# Patient Record
Sex: Female | Born: 1990 | Race: Black or African American | Hispanic: No | Marital: Single | Smoking: Former smoker
Health system: Southern US, Community
[De-identification: ages and names within clinical notes are randomized; demographics above are authoritative.]

## PROBLEM LIST (undated history)

## (undated) ENCOUNTER — Inpatient Hospital Stay (HOSPITAL_COMMUNITY): Payer: Self-pay

## (undated) DIAGNOSIS — B009 Herpesviral infection, unspecified: Secondary | ICD-10-CM

## (undated) DIAGNOSIS — R51 Headache: Secondary | ICD-10-CM

## (undated) DIAGNOSIS — Z8781 Personal history of (healed) traumatic fracture: Secondary | ICD-10-CM

## (undated) DIAGNOSIS — B9689 Other specified bacterial agents as the cause of diseases classified elsewhere: Secondary | ICD-10-CM

## (undated) DIAGNOSIS — N760 Acute vaginitis: Secondary | ICD-10-CM

## (undated) DIAGNOSIS — B999 Unspecified infectious disease: Secondary | ICD-10-CM

## (undated) HISTORY — PX: TONSILLECTOMY: SUR1361

## (undated) HISTORY — PX: ROOT CANAL: SHX2363

---
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
## 2008-04-05 ENCOUNTER — Emergency Department (HOSPITAL_COMMUNITY): Admission: EM | Admit: 2008-04-05 | Discharge: 2008-04-05 | Payer: Self-pay | Admitting: Family Medicine

## 2009-10-01 ENCOUNTER — Emergency Department (HOSPITAL_COMMUNITY): Admission: EM | Admit: 2009-10-01 | Discharge: 2009-10-01 | Payer: Self-pay | Admitting: Family Medicine

## 2009-12-28 ENCOUNTER — Emergency Department (HOSPITAL_COMMUNITY): Admission: EM | Admit: 2009-12-28 | Discharge: 2009-12-28 | Payer: Self-pay | Admitting: Emergency Medicine

## 2010-02-22 ENCOUNTER — Inpatient Hospital Stay (HOSPITAL_COMMUNITY)
Admission: AD | Admit: 2010-02-22 | Discharge: 2010-02-22 | Payer: Self-pay | Source: Home / Self Care | Attending: Obstetrics and Gynecology | Admitting: Obstetrics and Gynecology

## 2010-02-27 LAB — GC/CHLAMYDIA PROBE AMP, GENITAL
Chlamydia, DNA Probe: NEGATIVE
GC Probe Amp, Genital: NEGATIVE

## 2010-02-27 LAB — URINALYSIS, ROUTINE W REFLEX MICROSCOPIC
Bilirubin Urine: NEGATIVE
Hgb urine dipstick: NEGATIVE
Ketones, ur: 40 mg/dL — AB
Nitrite: NEGATIVE
Protein, ur: NEGATIVE mg/dL
Specific Gravity, Urine: 1.025 (ref 1.005–1.030)
Urine Glucose, Fasting: NEGATIVE mg/dL
Urobilinogen, UA: 0.2 mg/dL (ref 0.0–1.0)
pH: 6 (ref 5.0–8.0)

## 2010-02-27 LAB — CBC
HCT: 34.8 % — ABNORMAL LOW (ref 36.0–46.0)
Hemoglobin: 11.9 g/dL — ABNORMAL LOW (ref 12.0–15.0)
MCH: 30.9 pg (ref 26.0–34.0)
MCHC: 34.2 g/dL (ref 30.0–36.0)
MCV: 90.4 fL (ref 78.0–100.0)
Platelets: 264 10*3/uL (ref 150–400)
RBC: 3.85 MIL/uL — ABNORMAL LOW (ref 3.87–5.11)
RDW: 13.8 % (ref 11.5–15.5)
WBC: 8.7 10*3/uL (ref 4.0–10.5)

## 2010-02-27 LAB — WET PREP, GENITAL
Trich, Wet Prep: NONE SEEN
Yeast Wet Prep HPF POC: NONE SEEN

## 2010-02-27 LAB — POCT PREGNANCY, URINE: Preg Test, Ur: POSITIVE

## 2010-04-25 LAB — URINALYSIS, ROUTINE W REFLEX MICROSCOPIC
Bilirubin Urine: NEGATIVE
Glucose, UA: NEGATIVE mg/dL
Hgb urine dipstick: NEGATIVE
Ketones, ur: >80 mg/dL — AB
Nitrite: NEGATIVE
Protein, ur: NEGATIVE mg/dL
Specific Gravity, Urine: 1.016 (ref 1.005–1.030)
Urobilinogen, UA: 1 mg/dL (ref 0.0–1.0)
pH: 6 (ref 5.0–8.0)

## 2010-04-25 LAB — URINE MICROSCOPIC-ADD ON

## 2010-04-25 LAB — POCT PREGNANCY, URINE: Preg Test, Ur: POSITIVE

## 2010-08-04 ENCOUNTER — Inpatient Hospital Stay (HOSPITAL_COMMUNITY)
Admission: AD | Admit: 2010-08-04 | Discharge: 2010-08-04 | Disposition: A | Discharge status: Home / Self Care | Payer: Medicaid Other | Source: Ambulatory Visit | Attending: Obstetrics | Admitting: Obstetrics

## 2010-08-04 DIAGNOSIS — O479 False labor, unspecified: Secondary | ICD-10-CM | POA: Insufficient documentation

## 2010-08-05 ENCOUNTER — Inpatient Hospital Stay (HOSPITAL_COMMUNITY)
Admission: AD | Admit: 2010-08-05 | Discharge: 2010-08-07 | DRG: 775 | Disposition: A | Discharge status: Home / Self Care | Payer: Medicaid Other | Source: Ambulatory Visit | Attending: Obstetrics | Admitting: Obstetrics

## 2010-08-05 DIAGNOSIS — Z22330 Carrier of Group B streptococcus: Secondary | ICD-10-CM

## 2010-08-05 DIAGNOSIS — O99892 Other specified diseases and conditions complicating childbirth: Secondary | ICD-10-CM | POA: Diagnosis present

## 2010-08-05 LAB — CBC
HCT: 36.5 % (ref 36.0–46.0)
Hemoglobin: 12.6 g/dL (ref 12.0–15.0)
MCH: 32.8 pg (ref 26.0–34.0)
MCHC: 34.5 g/dL (ref 30.0–36.0)
MCV: 95.1 fL (ref 78.0–100.0)
Platelets: 188 10*3/uL (ref 150–400)
RBC: 3.84 MIL/uL — ABNORMAL LOW (ref 3.87–5.11)
RDW: 14 % (ref 11.5–15.5)
WBC: 10.6 10*3/uL — ABNORMAL HIGH (ref 4.0–10.5)

## 2010-08-05 LAB — RPR: RPR Ser Ql: NONREACTIVE

## 2010-08-06 LAB — CBC
HCT: 33.1 % — ABNORMAL LOW (ref 36.0–46.0)
Hemoglobin: 11.1 g/dL — ABNORMAL LOW (ref 12.0–15.0)
MCH: 31.7 pg (ref 26.0–34.0)
MCHC: 33.5 g/dL (ref 30.0–36.0)
MCV: 94.6 fL (ref 78.0–100.0)
Platelets: 184 10*3/uL (ref 150–400)
RBC: 3.5 MIL/uL — ABNORMAL LOW (ref 3.87–5.11)
RDW: 14.1 % (ref 11.5–15.5)
WBC: 14.4 10*3/uL — ABNORMAL HIGH (ref 4.0–10.5)

## 2010-10-17 ENCOUNTER — Emergency Department (HOSPITAL_COMMUNITY)
Admission: EM | Admit: 2010-10-17 | Discharge: 2010-10-18 | Disposition: A | Discharge status: Home / Self Care | Payer: Self-pay | Attending: Emergency Medicine | Admitting: Emergency Medicine

## 2010-10-17 DIAGNOSIS — J069 Acute upper respiratory infection, unspecified: Secondary | ICD-10-CM | POA: Insufficient documentation

## 2010-10-17 DIAGNOSIS — J3489 Other specified disorders of nose and nasal sinuses: Secondary | ICD-10-CM | POA: Insufficient documentation

## 2010-10-17 DIAGNOSIS — R51 Headache: Secondary | ICD-10-CM | POA: Insufficient documentation

## 2010-10-17 DIAGNOSIS — IMO0001 Reserved for inherently not codable concepts without codable children: Secondary | ICD-10-CM | POA: Insufficient documentation

## 2010-11-07 LAB — POCT URINALYSIS DIP (DEVICE)
Glucose, UA: NEGATIVE
Nitrite: NEGATIVE
Operator id: 239701
Protein, ur: 100 — AB
Specific Gravity, Urine: 1.025
Urobilinogen, UA: 4 — ABNORMAL HIGH
pH: 6.5

## 2010-11-07 LAB — URINE CULTURE
Colony Count: NO GROWTH
Culture: NO GROWTH

## 2010-11-07 LAB — POCT PREGNANCY, URINE
Operator id: 239701
Preg Test, Ur: NEGATIVE

## 2010-11-24 LAB — POCT PREGNANCY, URINE
Operator id: 272551
Preg Test, Ur: NEGATIVE

## 2011-11-11 ENCOUNTER — Inpatient Hospital Stay (HOSPITAL_COMMUNITY): Payer: Self-pay

## 2011-11-11 ENCOUNTER — Encounter (HOSPITAL_COMMUNITY): Payer: Self-pay | Admitting: *Deleted

## 2011-11-11 ENCOUNTER — Inpatient Hospital Stay (HOSPITAL_COMMUNITY)
Admission: AD | Admit: 2011-11-11 | Discharge: 2011-11-12 | Disposition: A | Discharge status: Hospice | Payer: Self-pay | Source: Ambulatory Visit | Attending: Obstetrics & Gynecology | Admitting: Obstetrics & Gynecology

## 2011-11-11 DIAGNOSIS — R109 Unspecified abdominal pain: Secondary | ICD-10-CM | POA: Insufficient documentation

## 2011-11-11 DIAGNOSIS — O99891 Other specified diseases and conditions complicating pregnancy: Secondary | ICD-10-CM | POA: Insufficient documentation

## 2011-11-11 DIAGNOSIS — O26899 Other specified pregnancy related conditions, unspecified trimester: Secondary | ICD-10-CM

## 2011-11-11 LAB — URINALYSIS, ROUTINE W REFLEX MICROSCOPIC
Bilirubin Urine: NEGATIVE
Glucose, UA: NEGATIVE mg/dL
Hgb urine dipstick: NEGATIVE
Ketones, ur: NEGATIVE mg/dL
Leukocytes, UA: NEGATIVE
Nitrite: NEGATIVE
Protein, ur: NEGATIVE mg/dL
Specific Gravity, Urine: 1.02 (ref 1.005–1.030)
Urobilinogen, UA: 0.2 mg/dL (ref 0.0–1.0)
pH: 7 (ref 5.0–8.0)

## 2011-11-11 LAB — CBC WITH DIFFERENTIAL/PLATELET
Basophils Absolute: 0 10*3/uL (ref 0.0–0.1)
HCT: 33.6 % — ABNORMAL LOW (ref 36.0–46.0)
Lymphocytes Relative: 48 % — ABNORMAL HIGH (ref 12–46)
Monocytes Absolute: 0.6 10*3/uL (ref 0.1–1.0)
Neutro Abs: 3.5 10*3/uL (ref 1.7–7.7)
RBC: 3.7 MIL/uL — ABNORMAL LOW (ref 3.87–5.11)
RDW: 14.1 % (ref 11.5–15.5)
WBC: 8.2 10*3/uL (ref 4.0–10.5)

## 2011-11-11 LAB — WET PREP, GENITAL: Yeast Wet Prep HPF POC: NONE SEEN

## 2011-11-11 LAB — HCG, QUANTITATIVE, PREGNANCY: hCG, Beta Chain, Quant, S: 1413 m[IU]/mL — ABNORMAL HIGH (ref <5)

## 2011-11-11 LAB — POCT PREGNANCY, URINE: Preg Test, Ur: POSITIVE — AB

## 2011-11-11 NOTE — MAU Provider Note (Signed)
History     CSN: [id number]  Arrival date and time: 11/11/11 2058   None     Chief Complaint  Patient presents with  . Abdominal Pain  . Back Pain   HPI Janet Rodriguez is a 21 y.o. female @ 5.[redacted] weeks gestation who presents to MAU with abdominal pain. The pain is located in the lower abdomen. The pain started 2 weeks ago. She describes the pain as cramping. She rates the pain as 6/10. The pain comes and goes. The pain radiates to the lower back. Associated symptoms include dizziness. Last pap smear last year and was normal. The history was provided by the patient.  OB History    Grav Para Term Preterm Abortions TAB SAB Ect Mult Living   1 1 1       1       Past Medical History  Diagnosis Date  . No pertinent past medical history     Past Surgical History  Procedure Date  . No past surgeries     History reviewed. No pertinent family history.  History  Substance Use Topics  . Smoking status: Current Some Day Smoker -- 0.2 packs/day    Types: Cigarettes  . Smokeless tobacco: Not on file  . Alcohol Use: No    Allergies: No Known Allergies  No prescriptions prior to admission    Review of Systems  Constitutional: Negative for fever, chills and weight loss.  HENT: Negative for ear pain, nosebleeds, congestion, sore throat and neck pain.   Eyes: Negative for blurred vision, double vision, photophobia and pain.  Respiratory: Negative for cough, shortness of breath and wheezing.   Cardiovascular: Negative for chest pain, palpitations and leg swelling.  Gastrointestinal: Positive for heartburn and abdominal pain. Negative for nausea, vomiting, diarrhea and constipation.  Genitourinary: Positive for frequency. Negative for dysuria and urgency.  Musculoskeletal: Positive for back pain. Negative for myalgias.  Skin: Negative for itching and rash.  Neurological: Positive for dizziness. Negative for sensory change, speech change, seizures, weakness and headaches.    Endo/Heme/Allergies: Does not bruise/bleed easily.  Psychiatric/Behavioral: Negative for depression. The patient is not nervous/anxious and does not have insomnia.    Physical Exam   Blood pressure 112/61, pulse 80, temperature 97.9 F (36.6 C), temperature source Oral, resp. rate 16, height 5\' 5"  (1.651 m), weight 115 lb 9.6 oz (52.436 kg), last menstrual period 10/03/2011.  Physical Exam  Nursing note and vitals reviewed. Constitutional: She is oriented to person, place, and time. She appears well-developed and well-nourished. No distress.  HENT:  Head: Normocephalic and atraumatic.  Eyes: EOM are normal.  Neck: Neck supple.  Cardiovascular: Normal rate.   Respiratory: Effort normal.  GI: Soft. There is no tenderness.  Genitourinary:       External genitalia without lesions. Down discharge vaginal vault. Cervix long, closed, no CMT, no adnexal tenderness. Uterus without palpable enlargement.  Musculoskeletal: Normal range of motion.  Neurological: She is alert and oriented to person, place, and time.  Skin: Skin is warm and dry.  Psychiatric: She has a normal mood and affect. Her behavior is normal. Judgment and thought content normal.   Results for orders placed during the hospital encounter of 11/11/11 (from the past 24 hour(s))  URINALYSIS, ROUTINE W REFLEX MICROSCOPIC     Status: Normal   Collection Time   11/11/11  9:15 PM      Component Value Range   Color, Urine YELLOW  YELLOW   APPearance CLEAR  CLEAR  Specific Gravity, Urine 1.020  1.005 - 1.030   pH 7.0  5.0 - 8.0   Glucose, UA NEGATIVE  NEGATIVE mg/dL   Hgb urine dipstick NEGATIVE  NEGATIVE   Bilirubin Urine NEGATIVE  NEGATIVE   Ketones, ur NEGATIVE  NEGATIVE mg/dL   Protein, ur NEGATIVE  NEGATIVE mg/dL   Urobilinogen, UA 0.2  0.0 - 1.0 mg/dL   Nitrite NEGATIVE  NEGATIVE   Leukocytes, UA NEGATIVE  NEGATIVE  POCT PREGNANCY, URINE     Status: Abnormal   Collection Time   11/11/11  9:21 PM      Component Value  Range   Preg Test, Ur POSITIVE (*) NEGATIVE  CBC WITH DIFFERENTIAL     Status: Abnormal   Collection Time   11/11/11 10:08 PM      Component Value Range   WBC 8.2  4.0 - 10.5 K/uL   RBC 3.70 (*) 3.87 - 5.11 MIL/uL   Hemoglobin 11.3 (*) 12.0 - 15.0 g/dL   HCT 16.1 (*) 09.6 - 04.5 %   MCV 90.8  78.0 - 100.0 fL   MCH 30.5  26.0 - 34.0 pg   MCHC 33.6  30.0 - 36.0 g/dL   RDW 40.9  81.1 - 91.4 %   Platelets 285  150 - 400 K/uL   Neutrophils Relative 43  43 - 77 %   Neutro Abs 3.5  1.7 - 7.7 K/uL   Lymphocytes Relative 48 (*) 12 - 46 %   Lymphs Abs 3.9  0.7 - 4.0 K/uL   Monocytes Relative 7  3 - 12 %   Monocytes Absolute 0.6  0.1 - 1.0 K/uL   Eosinophils Relative 3  0 - 5 %   Eosinophils Absolute 0.2  0.0 - 0.7 K/uL   Basophils Relative 0  0 - 1 %   Basophils Absolute 0.0  0.0 - 0.1 K/uL  ABO/RH     Status: Normal (Preliminary result)   Collection Time   11/11/11 10:08 PM      Component Value Range   ABO/RH(D) A POS    HCG, QUANTITATIVE, PREGNANCY     Status: Abnormal   Collection Time   11/11/11 10:22 PM      Component Value Range   hCG, Beta Chain, Quant, S 1413 (*) <5 mIU/mL  WET PREP, GENITAL     Status: Abnormal   Collection Time   11/11/11 11:15 PM      Component Value Range   Yeast Wet Prep HPF POC NONE SEEN  NONE SEEN   Trich, Wet Prep NONE SEEN  NONE SEEN   Clue Cells Wet Prep HPF POC NONE SEEN  NONE SEEN   WBC, Wet Prep HPF POC MODERATE (*) NONE SEEN   US Ob Comp Less 14 Wks  11/12/2011  *RADIOLOGY REPORT*  Clinical Data: Pain with positive pregnancy test.  OBSTETRIC <14 WK [place] AND TRANSVAGINAL OB US  Technique:  Both transabdominal and transvaginal ultrasound examinations were performed for complete evaluation of the gestation as well as the maternal uterus, adnexal regions, and pelvic cul-de-sac.  Transvaginal technique was performed to assess early pregnancy.  Comparison:  None.  Intrauterine gestational sac:  The tiny cystic structure is identified in the endometrial  cavity the uterus.  This appears to be within the decidualized endometrium. Yolk sac: Not visualized Embryo: Not visualized Cardiac Activity: N/A  MSD: 4 mm  4 w 6 d [place] EDC: 07/14/2012  Maternal uterus/adnexae: No evidence for subchorionic hemorrhage. Right ovary is unremarkable  with corpus luteum cyst seen on the left. Trace amount of free fluid is seen just anterior to the uterus.  IMPRESSION: Tiny cystic structure in the endometrium suggests a tiny gestational sac with estimated 4-week-6-day gestational age.  No adnexal mass is evident to suggest ectopic gestation at this time. Only a very trace amount of free fluid is seen just anterior to the uterus.  Correlation with serial beta HCG is recommended and repeat ultrasound could be performed and 7-10 days to confirm appropriate pregnancy progression.   Original Report Authenticated By: ERIC A. MANSELL, M.D.    US Ob Transvaginal  11/12/2011  *RADIOLOGY REPORT*  Clinical Data: Pain with positive pregnancy test.  OBSTETRIC <14 WK [place] AND TRANSVAGINAL OB US  Technique:  Both transabdominal and transvaginal ultrasound examinations were performed for complete evaluation of the gestation as well as the maternal uterus, adnexal regions, and pelvic cul-de-sac.  Transvaginal technique was performed to assess early pregnancy.  Comparison:  None.  Intrauterine gestational sac:  The tiny cystic structure is identified in the endometrial cavity the uterus.  This appears to be within the decidualized endometrium. Yolk sac: Not visualized Embryo: Not visualized Cardiac Activity: N/A  MSD: 4 mm  4 w 6 d [place] EDC: 07/14/2012  Maternal uterus/adnexae: No evidence for subchorionic hemorrhage. Right ovary is unremarkable with corpus luteum cyst seen on the left. Trace amount of free fluid is seen just anterior to the uterus.  IMPRESSION: Tiny cystic structure in the endometrium suggests a tiny gestational sac with estimated 4-week-6-day gestational age.  No adnexal mass is evident to  suggest ectopic gestation at this time. Only a very trace amount of free fluid is seen just anterior to the uterus.  Correlation with serial beta HCG is recommended and repeat ultrasound could be performed and 7-10 days to confirm appropriate pregnancy progression.   Original Report Authenticated By: ERIC A. MANSELL, M.D.    Assessment: 21 y.o. female @ 4 weeks 6 days gestation with abdominal pain   Discomforts of pregnancy  Plan:  Repeat Bhcg in 48 hours, return sooner for problems.  I have reviewed this patient's vital signs, nurses notes, appropriate labs and imaging.  MAU Course  Procedures   NEESE,HOPE, RN, FNP, [facility] 11/11/2011, 11:16 PM

## 2011-11-11 NOTE — MAU Note (Signed)
Pt LMP 8/21, only lasting 2 days having lower abd and back pain x 2 wks.  Denies bleeding, discharge or pain with urination.

## 2011-11-13 NOTE — MAU Provider Note (Signed)
Medical Screening exam and patient care preformed by advanced practice provider.  Agree with the above management.  

## 2011-12-18 ENCOUNTER — Encounter (HOSPITAL_COMMUNITY): Payer: Self-pay | Admitting: Family

## 2011-12-18 ENCOUNTER — Inpatient Hospital Stay (HOSPITAL_COMMUNITY)
Admission: AD | Admit: 2011-12-18 | Discharge: 2011-12-18 | Disposition: A | Discharge status: Home / Self Care | Payer: Medicaid Other | Source: Ambulatory Visit | Attending: Family Medicine | Admitting: Family Medicine

## 2011-12-18 DIAGNOSIS — R42 Dizziness and giddiness: Secondary | ICD-10-CM | POA: Insufficient documentation

## 2011-12-18 DIAGNOSIS — B373 Candidiasis of vulva and vagina: Secondary | ICD-10-CM

## 2011-12-18 DIAGNOSIS — R51 Headache: Secondary | ICD-10-CM | POA: Insufficient documentation

## 2011-12-18 DIAGNOSIS — L293 Anogenital pruritus, unspecified: Secondary | ICD-10-CM | POA: Insufficient documentation

## 2011-12-18 DIAGNOSIS — N760 Acute vaginitis: Secondary | ICD-10-CM | POA: Insufficient documentation

## 2011-12-18 DIAGNOSIS — O2390 Unspecified genitourinary tract infection in pregnancy, unspecified trimester: Secondary | ICD-10-CM | POA: Insufficient documentation

## 2011-12-18 LAB — WET PREP, GENITAL
Clue Cells Wet Prep HPF POC: NONE SEEN
Trich, Wet Prep: NONE SEEN

## 2011-12-18 LAB — URINALYSIS, ROUTINE W REFLEX MICROSCOPIC
Bilirubin Urine: NEGATIVE
Glucose, UA: NEGATIVE mg/dL
Hgb urine dipstick: NEGATIVE
Ketones, ur: NEGATIVE mg/dL
Nitrite: NEGATIVE

## 2011-12-18 MED ORDER — ACETAMINOPHEN 500 MG PO TABS
1000.0000 mg | ORAL_TABLET | Freq: Four times a day (QID) | ORAL | Status: DC | PRN
  Administered 2011-12-18: 1000 mg via ORAL
  Filled 2011-12-18: qty 2

## 2011-12-18 MED ORDER — CLOTRIMAZOLE 1 % EX CREA: TOPICAL_CREAM | CUTANEOUS | Status: DC

## 2011-12-18 NOTE — MAU Provider Note (Signed)
History     CSN: [id number]  Arrival date and time: 12/18/11 1227   None     No chief complaint on file.  HPI 21 y.o. G2P1001 at [redacted]w[redacted]d with dizziness, headache, vaginal itching.  Headache throughout pregnancy. Nauseated. No bleeding or cramping. Vaginal discharge - thick and gooey.   OB History    Grav Para Term Preterm Abortions TAB SAB Ect Mult Living   2 1 1       1       Past Medical History  Diagnosis Date  . No pertinent past medical history     Past Surgical History  Procedure Date  . No past surgeries     History reviewed. No pertinent family history.  History  Substance Use Topics  . Smoking status: Former Smoker -- 0.2 packs/day    Types: Cigarettes    Quit date: 10/28/2011  . Smokeless tobacco: Not on file  . Alcohol Use: No    Allergies: No Known Allergies  No prescriptions prior to admission    Review of Systems  Constitutional: Negative for fever and chills.  Eyes:       Spots in vision when stands up  Respiratory: Negative for shortness of breath.   Cardiovascular: Negative for chest pain and palpitations.  Gastrointestinal: Positive for nausea and vomiting. Negative for abdominal pain, diarrhea and constipation.  Genitourinary: Negative for dysuria.  Neurological: Positive for dizziness. Negative for headaches.   Physical Exam   Blood pressure 87/68, pulse 83, temperature 97.2 F (36.2 C), temperature source Oral, resp. rate 16, height 5\' 5"  (1.651 m), weight 50.894 kg (112 lb 3.2 oz), last menstrual period 10/03/2011.  Physical Exam  Constitutional: She is oriented to person, place, and time. She appears well-developed and well-nourished. No distress.  HENT:  Head: Normocephalic and atraumatic.  Eyes: Conjunctivae normal and EOM are normal.  Neck: Normal range of motion. Neck supple.  Cardiovascular: Normal rate, regular rhythm and normal heart sounds.   Respiratory: Effort normal and breath sounds normal. No respiratory distress.    GI: Soft. There is no tenderness. There is no rebound and no guarding.  Genitourinary:       Normal external genitalia, Erythematous  vagina, moderate thick Schaff discharge. Tender on speculum exam.   Musculoskeletal: Normal range of motion. She exhibits no edema and no tenderness.  Neurological: She is alert and oriented to person, place, and time.  Skin: Skin is warm and dry.  Psychiatric: She has a normal mood and affect.    Results for orders placed during the hospital encounter of 12/18/11 (from the past 24 hour(s))  URINALYSIS, ROUTINE W REFLEX MICROSCOPIC     Status: Normal   Collection Time   12/18/11 12:40 PM      Component Value Range   Color, Urine YELLOW  YELLOW   APPearance CLEAR  CLEAR   Specific Gravity, Urine 1.025  1.005 - 1.030   pH 6.5  5.0 - 8.0   Glucose, UA NEGATIVE  NEGATIVE mg/dL   Hgb urine dipstick NEGATIVE  NEGATIVE   Bilirubin Urine NEGATIVE  NEGATIVE   Ketones, ur NEGATIVE  NEGATIVE mg/dL   Protein, ur NEGATIVE  NEGATIVE mg/dL   Urobilinogen, UA 0.2  0.0 - 1.0 mg/dL   Nitrite NEGATIVE  NEGATIVE   Leukocytes, UA NEGATIVE  NEGATIVE  WET PREP, GENITAL     Status: Abnormal   Collection Time   12/18/11  1:15 PM      Component Value Range   Yeast  Wet Prep HPF POC NONE SEEN  NONE SEEN   Trich, Wet Prep NONE SEEN  NONE SEEN   Clue Cells Wet Prep HPF POC NONE SEEN  NONE SEEN   WBC, Wet Prep HPF POC FEW (*) NONE SEEN   FHTs by ultrasound:  143 Bedside dono shows viable IUP.  MAU Course  Procedures  1000 mg Tylenol given in ED and PO fluids Repeat BP 93/76.  Pt feeling better.  Assessment and Plan  21 y.o. G2P1001 at [redacted]w[redacted]d by LMP  1.  IUP - viable, positive FHTs 2.  Vaginitis, will treat for yeast despite negative wet prep. Clotrimazole vaginal cream 3.  Headache/dizziness - low BP. Tylenol, fluids, good hydration, zofran for nausea. 4.  Establish care with Dr. Gaynell Face as soon as possible.   Napoleon Form, MD  Napoleon Form 12/18/2011, 1:04 PM

## 2011-12-18 NOTE — MAU Note (Signed)
Pt reports vaginal itching for about one month.

## 2012-02-13 NOTE — L&D Delivery Note (Signed)
Delivery Note At 5:56 PM a viable female was delivered via  (Presentation: ;  ).  APGAR: , ; weight .   Placenta status: , .  Cord:  with the following complications: .  Cord pH: not done  Anesthesia: Epidural  Episiotomy:  Lacerations:  Suture Repair: 2.0 Est. Blood Loss (mL):   Mom to postpartum.  Baby to nursery-stable.  Ferd Horrigan A 07/09/2012, 6:03 PM

## 2012-02-18 ENCOUNTER — Encounter (HOSPITAL_COMMUNITY): Payer: Self-pay | Admitting: Advanced Practice Midwife

## 2012-02-18 ENCOUNTER — Inpatient Hospital Stay (HOSPITAL_COMMUNITY)
Admission: AD | Admit: 2012-02-18 | Discharge: 2012-02-18 | Disposition: A | Discharge status: Home / Self Care | Payer: Medicaid Other | Source: Ambulatory Visit | Attending: Obstetrics & Gynecology | Admitting: Obstetrics & Gynecology

## 2012-02-18 DIAGNOSIS — O0930 Supervision of pregnancy with insufficient antenatal care, unspecified trimester: Secondary | ICD-10-CM

## 2012-02-18 DIAGNOSIS — Z3490 Encounter for supervision of normal pregnancy, unspecified, unspecified trimester: Secondary | ICD-10-CM

## 2012-02-18 DIAGNOSIS — Z3201 Encounter for pregnancy test, result positive: Secondary | ICD-10-CM

## 2012-02-18 DIAGNOSIS — Z3480 Encounter for supervision of other normal pregnancy, unspecified trimester: Secondary | ICD-10-CM | POA: Insufficient documentation

## 2012-02-18 HISTORY — DX: Headache: R51

## 2012-02-18 NOTE — MAU Provider Note (Signed)
  History     CSN: [id number]  Arrival date and time: 02/18/12 2013   First Provider Initiated Contact with Patient 02/18/12 2158      Chief Complaint  Patient presents with  . New Evaluation   HPI This is a 22 y.o. G2 P1 at [redacted]w[redacted]d who presents "to get a check up for my baby and me".  Has not gotten Medicaid yet so cannot go see Dr Gaynell Face and just wants to see if baby is OK. Denies complaints. Feels movement this week. Denies bleeding.   RN Note: Pt G2 P1 at 19.5wks, no prenatal care due to lack of medicaid. Pt requesting check up for her baby. Denies pain or bleeding.      OB History    Grav Para Term Preterm Abortions TAB SAB Ect Mult Living   2 1 1       1       Past Medical History  Diagnosis Date  . Headache     Past Surgical History  Procedure Date  . No past surgeries     History reviewed. No pertinent family history.  History  Substance Use Topics  . Smoking status: Former Smoker -- 0.2 packs/day    Types: Cigarettes    Quit date: 10/28/2011  . Smokeless tobacco: Not on file  . Alcohol Use: No    Allergies: No Known Allergies  Prescriptions prior to admission  Medication Sig Dispense Refill  . acetaminophen (TYLENOL) 500 MG tablet Take 500 mg by mouth every 6 (six) hours as needed. migrain      . Prenatal Vit-Fe Fumarate-FA (PRENATAL MULTIVITAMIN) TABS Take 1 tablet by mouth every morning.      . clotrimazole (LOTRIMIN) 1 % cream Apply to affected area 2 times daily  15 g  0    Review of Systems  Constitutional: Negative for fever and malaise/fatigue.  Respiratory: Negative for cough.   Cardiovascular: Negative for chest pain.  Gastrointestinal: Negative for heartburn, nausea, vomiting, abdominal pain, diarrhea and constipation.  Genitourinary: Negative for dysuria.  Neurological: Negative for dizziness and headaches.   Physical Exam   Blood pressure 107/53, pulse 78, temperature 98 F (36.7 C), temperature source Oral, resp. rate 18, height  5\' 6"  (1.676 m), weight 122 lb 9.6 oz (55.611 kg), last menstrual period 10/03/2011.  Physical Exam  Constitutional: She is oriented to person, place, and time. She appears well-developed and well-nourished. No distress.  HENT:  Head: Normocephalic.  Neck: Normal range of motion. Neck supple.  Cardiovascular: Normal rate.   Respiratory: Effort normal.  GI: Soft. She exhibits no distension and no mass. There is no tenderness. There is no rebound and no guarding.       Fundus at umbilicus  Genitourinary: No vaginal discharge found.  Musculoskeletal: Normal range of motion.  Neurological: She is alert and oriented to person, place, and time.  Skin: Skin is warm and dry.  Psychiatric: She has a normal mood and affect.   FHR 160s per RN No results found for this or any previous visit (from the past 24 hour(s)).  MAU Course  Procedures  Assessment and Plan  A:  SIUP at [redacted]w[redacted]d       Normal exam  P:  Will schedule outpatient anatomy scan       Pt to go to Dr Gaynell Face for prenatal care.   Wynelle Bourgeois 02/18/2012, 10:06 PM

## 2012-02-18 NOTE — MAU Note (Signed)
Pt G2 P1 at 19.5wks, no prenatal care due to lack of medicaid.  Pt requesting check up for her baby.  Denies pain or bleeding.

## 2012-02-21 ENCOUNTER — Ambulatory Visit (HOSPITAL_COMMUNITY)
Admission: RE | Admit: 2012-02-21 | Discharge: 2012-02-21 | Disposition: A | Discharge status: Home / Self Care | Payer: Medicaid Other | Source: Ambulatory Visit | Attending: Advanced Practice Midwife | Admitting: Advanced Practice Midwife

## 2012-02-21 DIAGNOSIS — Z363 Encounter for antenatal screening for malformations: Secondary | ICD-10-CM | POA: Insufficient documentation

## 2012-02-21 DIAGNOSIS — Z3490 Encounter for supervision of normal pregnancy, unspecified, unspecified trimester: Secondary | ICD-10-CM

## 2012-02-21 DIAGNOSIS — O358XX0 Maternal care for other (suspected) fetal abnormality and damage, not applicable or unspecified: Secondary | ICD-10-CM | POA: Insufficient documentation

## 2012-02-21 DIAGNOSIS — O0930 Supervision of pregnancy with insufficient antenatal care, unspecified trimester: Secondary | ICD-10-CM | POA: Insufficient documentation

## 2012-02-21 DIAGNOSIS — Z1389 Encounter for screening for other disorder: Secondary | ICD-10-CM | POA: Insufficient documentation

## 2012-05-02 LAB — OB RESULTS CONSOLE GC/CHLAMYDIA
Chlamydia: NEGATIVE
Gonorrhea: NEGATIVE

## 2012-05-02 LAB — OB RESULTS CONSOLE RPR: RPR: NONREACTIVE

## 2012-05-02 LAB — OB RESULTS CONSOLE ANTIBODY SCREEN: Antibody Screen: NEGATIVE

## 2012-06-03 LAB — OB RESULTS CONSOLE GBS: GBS: NEGATIVE

## 2012-06-23 ENCOUNTER — Inpatient Hospital Stay (HOSPITAL_COMMUNITY)
Admission: AD | Admit: 2012-06-23 | Discharge: 2012-06-24 | Disposition: A | Discharge status: Home / Self Care | Payer: Medicaid Other | Source: Ambulatory Visit | Attending: Obstetrics | Admitting: Obstetrics

## 2012-06-23 DIAGNOSIS — O479 False labor, unspecified: Secondary | ICD-10-CM | POA: Insufficient documentation

## 2012-06-23 NOTE — MAU Note (Signed)
Pt G2 P1 at 37.5wks having contractions since 2230.

## 2012-07-09 ENCOUNTER — Inpatient Hospital Stay (HOSPITAL_COMMUNITY): Payer: Medicaid Other | Admitting: Anesthesiology

## 2012-07-09 ENCOUNTER — Inpatient Hospital Stay (HOSPITAL_COMMUNITY)
Admission: RE | Admit: 2012-07-09 | Discharge: 2012-07-10 | DRG: 774 | Disposition: A | Discharge status: Home / Self Care | Payer: Medicaid Other | Source: Ambulatory Visit | Attending: Obstetrics | Admitting: Obstetrics

## 2012-07-09 ENCOUNTER — Encounter (HOSPITAL_COMMUNITY): Payer: Self-pay | Admitting: Anesthesiology

## 2012-07-09 ENCOUNTER — Encounter (HOSPITAL_COMMUNITY): Payer: Self-pay

## 2012-07-09 DIAGNOSIS — Z22330 Carrier of Group B streptococcus: Secondary | ICD-10-CM

## 2012-07-09 DIAGNOSIS — O98519 Other viral diseases complicating pregnancy, unspecified trimester: Secondary | ICD-10-CM | POA: Diagnosis present

## 2012-07-09 DIAGNOSIS — A6000 Herpesviral infection of urogenital system, unspecified: Secondary | ICD-10-CM | POA: Diagnosis present

## 2012-07-09 DIAGNOSIS — O99892 Other specified diseases and conditions complicating childbirth: Principal | ICD-10-CM | POA: Diagnosis present

## 2012-07-09 HISTORY — DX: Unspecified infectious disease: B99.9

## 2012-07-09 LAB — CBC
HCT: 35.2 % — ABNORMAL LOW (ref 36.0–46.0)
Hemoglobin: 11.9 g/dL — ABNORMAL LOW (ref 12.0–15.0)
MCH: 31.2 pg (ref 26.0–34.0)
MCV: 92.4 fL (ref 78.0–100.0)
Platelets: 236 10*3/uL (ref 150–400)
RBC: 3.81 MIL/uL — ABNORMAL LOW (ref 3.87–5.11)
WBC: 9.8 10*3/uL (ref 4.0–10.5)

## 2012-07-09 MED ORDER — ONDANSETRON HCL 4 MG/2ML IJ SOLN: 4.0000 mg | INTRAMUSCULAR | Status: DC | PRN

## 2012-07-09 MED ORDER — PHENYLEPHRINE 40 MCG/ML (10ML) SYRINGE FOR IV PUSH (FOR BLOOD PRESSURE SUPPORT)
80.0000 ug | PREFILLED_SYRINGE | INTRAVENOUS | Status: DC | PRN
  Filled 2012-07-09: qty 2
  Filled 2012-07-09: qty 5

## 2012-07-09 MED ORDER — FERROUS SULFATE 325 (65 FE) MG PO TABS
325.0000 mg | ORAL_TABLET | Freq: Two times a day (BID) | ORAL | Status: DC
  Administered 2012-07-10 (×2): 325 mg via ORAL
  Filled 2012-07-09 (×2): qty 1

## 2012-07-09 MED ORDER — LACTATED RINGERS IV SOLN
INTRAVENOUS | Status: DC
  Administered 2012-07-09: 250 mL via INTRAVENOUS
  Administered 2012-07-09: 125 mL/h via INTRAVENOUS

## 2012-07-09 MED ORDER — ONDANSETRON HCL 4 MG/2ML IJ SOLN
4.0000 mg | Freq: Four times a day (QID) | INTRAMUSCULAR | Status: DC | PRN
  Administered 2012-07-09: 4 mg via INTRAVENOUS
  Filled 2012-07-09: qty 2

## 2012-07-09 MED ORDER — PRENATAL MULTIVITAMIN CH
1.0000 | ORAL_TABLET | Freq: Every day | ORAL | Status: DC
  Filled 2012-07-09: qty 1

## 2012-07-09 MED ORDER — CITRIC ACID-SODIUM CITRATE 334-500 MG/5ML PO SOLN: 30.0000 mL | ORAL | Status: DC | PRN

## 2012-07-09 MED ORDER — TETANUS-DIPHTH-ACELL PERTUSSIS 5-2.5-18.5 LF-MCG/0.5 IM SUSP: 0.5000 mL | Freq: Once | INTRAMUSCULAR | Status: DC

## 2012-07-09 MED ORDER — EPHEDRINE 5 MG/ML INJ
10.0000 mg | INTRAVENOUS | Status: DC | PRN
  Filled 2012-07-09: qty 4
  Filled 2012-07-09: qty 2

## 2012-07-09 MED ORDER — ONDANSETRON HCL 4 MG PO TABS: 4.0000 mg | ORAL_TABLET | ORAL | Status: DC | PRN

## 2012-07-09 MED ORDER — OXYTOCIN 40 UNITS IN LACTATED RINGERS INFUSION - SIMPLE MED
62.5000 mL/h | INTRAVENOUS | Status: DC
  Administered 2012-07-09: 62.5 mL/h via INTRAVENOUS

## 2012-07-09 MED ORDER — OXYCODONE-ACETAMINOPHEN 5-325 MG PO TABS
1.0000 | ORAL_TABLET | ORAL | Status: DC | PRN
  Administered 2012-07-09 – 2012-07-10 (×3): 2 via ORAL
  Filled 2012-07-09 (×3): qty 2

## 2012-07-09 MED ORDER — OXYTOCIN 40 UNITS IN LACTATED RINGERS INFUSION - SIMPLE MED
1.0000 m[IU]/min | INTRAVENOUS | Status: DC
  Administered 2012-07-09: 2 m[IU]/min via INTRAVENOUS
  Filled 2012-07-09: qty 1000

## 2012-07-09 MED ORDER — ACETAMINOPHEN 325 MG PO TABS: 650.0000 mg | ORAL_TABLET | ORAL | Status: DC | PRN

## 2012-07-09 MED ORDER — OXYCODONE-ACETAMINOPHEN 5-325 MG PO TABS: 1.0000 | ORAL_TABLET | ORAL | Status: DC | PRN

## 2012-07-09 MED ORDER — ZOLPIDEM TARTRATE 5 MG PO TABS: 5.0000 mg | ORAL_TABLET | Freq: Every evening | ORAL | Status: DC | PRN

## 2012-07-09 MED ORDER — DIBUCAINE 1 % RE OINT
1.0000 "application " | TOPICAL_OINTMENT | RECTAL | Status: DC | PRN
  Administered 2012-07-09: 1 via RECTAL
  Filled 2012-07-09: qty 28

## 2012-07-09 MED ORDER — LANOLIN HYDROUS EX OINT: TOPICAL_OINTMENT | CUTANEOUS | Status: DC | PRN

## 2012-07-09 MED ORDER — IBUPROFEN 600 MG PO TABS
600.0000 mg | ORAL_TABLET | Freq: Four times a day (QID) | ORAL | Status: DC | PRN
  Filled 2012-07-09: qty 1

## 2012-07-09 MED ORDER — EPHEDRINE 5 MG/ML INJ
10.0000 mg | INTRAVENOUS | Status: DC | PRN
  Filled 2012-07-09: qty 2

## 2012-07-09 MED ORDER — LACTATED RINGERS IV SOLN: 500.0000 mL | INTRAVENOUS | Status: DC | PRN

## 2012-07-09 MED ORDER — TERBUTALINE SULFATE 1 MG/ML IJ SOLN: 0.2500 mg | Freq: Once | INTRAMUSCULAR | Status: DC | PRN

## 2012-07-09 MED ORDER — VALACYCLOVIR HCL 500 MG PO TABS
500.0000 mg | ORAL_TABLET | Freq: Every day | ORAL | Status: DC
  Administered 2012-07-10: 500 mg via ORAL
  Filled 2012-07-09 (×3): qty 1

## 2012-07-09 MED ORDER — WITCH HAZEL-GLYCERIN EX PADS
1.0000 "application " | MEDICATED_PAD | CUTANEOUS | Status: DC | PRN
  Administered 2012-07-09: 1 via TOPICAL

## 2012-07-09 MED ORDER — FLEET ENEMA 7-19 GM/118ML RE ENEM: 1.0000 | ENEMA | Freq: Every day | RECTAL | Status: DC | PRN

## 2012-07-09 MED ORDER — SIMETHICONE 80 MG PO CHEW: 80.0000 mg | CHEWABLE_TABLET | ORAL | Status: DC | PRN

## 2012-07-09 MED ORDER — FENTANYL 2.5 MCG/ML BUPIVACAINE 1/10 % EPIDURAL INFUSION (WH - ANES)
14.0000 mL/h | INTRAMUSCULAR | Status: DC | PRN
  Administered 2012-07-09: 14 mL/h via EPIDURAL
  Filled 2012-07-09: qty 125

## 2012-07-09 MED ORDER — LIDOCAINE HCL (PF) 1 % IJ SOLN
30.0000 mL | INTRAMUSCULAR | Status: DC | PRN
  Filled 2012-07-09: qty 30

## 2012-07-09 MED ORDER — SENNOSIDES-DOCUSATE SODIUM 8.6-50 MG PO TABS
2.0000 | ORAL_TABLET | Freq: Every day | ORAL | Status: DC
  Administered 2012-07-09: 2 via ORAL

## 2012-07-09 MED ORDER — LIDOCAINE HCL (PF) 1 % IJ SOLN
INTRAMUSCULAR | Status: DC | PRN
  Administered 2012-07-09 (×4): 4 mL

## 2012-07-09 MED ORDER — IBUPROFEN 600 MG PO TABS
600.0000 mg | ORAL_TABLET | Freq: Four times a day (QID) | ORAL | Status: DC
  Administered 2012-07-09 – 2012-07-10 (×4): 600 mg via ORAL
  Filled 2012-07-09 (×3): qty 1

## 2012-07-09 MED ORDER — PHENYLEPHRINE 40 MCG/ML (10ML) SYRINGE FOR IV PUSH (FOR BLOOD PRESSURE SUPPORT)
80.0000 ug | PREFILLED_SYRINGE | INTRAVENOUS | Status: DC | PRN
  Filled 2012-07-09: qty 2

## 2012-07-09 MED ORDER — BUTORPHANOL TARTRATE 1 MG/ML IJ SOLN: 1.0000 mg | INTRAMUSCULAR | Status: DC | PRN

## 2012-07-09 MED ORDER — DIPHENHYDRAMINE HCL 50 MG/ML IJ SOLN: 12.5000 mg | INTRAMUSCULAR | Status: DC | PRN

## 2012-07-09 MED ORDER — BENZOCAINE-MENTHOL 20-0.5 % EX AERO
1.0000 "application " | INHALATION_SPRAY | CUTANEOUS | Status: DC | PRN
  Administered 2012-07-09: 1 via TOPICAL
  Filled 2012-07-09: qty 56

## 2012-07-09 MED ORDER — LACTATED RINGERS IV SOLN
500.0000 mL | Freq: Once | INTRAVENOUS | Status: AC
  Administered 2012-07-09: 500 mL via INTRAVENOUS

## 2012-07-09 MED ORDER — DIPHENHYDRAMINE HCL 25 MG PO CAPS: 25.0000 mg | ORAL_CAPSULE | Freq: Four times a day (QID) | ORAL | Status: DC | PRN

## 2012-07-09 MED ORDER — OXYTOCIN BOLUS FROM INFUSION
500.0000 mL | INTRAVENOUS | Status: DC
  Administered 2012-07-09: 500 mL via INTRAVENOUS

## 2012-07-09 NOTE — Plan of Care (Signed)
Problem: Phase I Progression Outcomes Goal: Pain controlled with appropriate interventions Outcome: Completed/Met Date Met:  07/09/12 Mom wants early discharge.

## 2012-07-09 NOTE — H&P (Signed)
This is Dr. 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

## 2012-07-09 NOTE — Progress Notes (Signed)
Pt and family having issues regarding visitors, security involved.  Pt mother left room.  Pt upset and crying

## 2012-07-09 NOTE — Anesthesia Procedure Notes (Signed)
Epidural Patient location during procedure: OB Start time: 07/09/2012 12:56 PM  Staffing Performed by: anesthesiologist   Preanesthetic Checklist Completed: patient identified, site marked, surgical consent, pre-op evaluation, timeout performed, IV checked, risks and benefits discussed and monitors and equipment checked  Epidural Patient position: sitting Prep: site prepped and draped and DuraPrep Patient monitoring: continuous pulse ox and blood pressure Approach: midline Injection technique: LOR air  Needle:  Needle type: Tuohy  Needle gauge: 17 G Needle length: 9 cm and 9 Needle insertion depth: 4.5 cm Catheter type: closed end flexible Catheter size: 19 Gauge Catheter at skin depth: 9.5 cm Test dose: negative  Assessment Events: blood not aspirated, injection not painful, no injection resistance, negative IV test and no paresthesia  Additional Notes Discussed risk of headache, infection, bleeding, nerve injury and failed or incomplete block.  Patient voices understanding and wishes to proceed.  Epidural placed easily on first attempt.  No paresthesia.  Patient tolerated procedure well with no apparent complications.  Jasmine December, MDReason for block:procedure for pain

## 2012-07-09 NOTE — Anesthesia Preprocedure Evaluation (Signed)
Anesthesia Evaluation  Patient identified by MRN, date of birth, ID band Patient awake    Reviewed: Allergy & Precautions, H&P , NPO status , Patient's Chart, lab work & pertinent test results, reviewed documented beta blocker date and time   History of Anesthesia Complications Negative for: history of anesthetic complications  Airway Mallampati: II TM Distance: >3 FB Neck ROM: full    Dental  (+) Teeth Intact   Pulmonary former smoker,  breath sounds clear to auscultation        Cardiovascular negative cardio ROS  Rhythm:regular Rate:Normal     Neuro/Psych negative neurological ROS  negative psych ROS   GI/Hepatic negative GI ROS, Neg liver ROS,   Endo/Other  negative endocrine ROS  Renal/GU negative Renal ROS     Musculoskeletal   Abdominal   Peds  Hematology negative hematology ROS (+)   Anesthesia Other Findings   Reproductive/Obstetrics (+) Pregnancy                           Anesthesia Physical Anesthesia Plan  ASA: II  Anesthesia Plan: Epidural   Post-op Pain Management:    Induction:   Airway Management Planned:   Additional Equipment:   Intra-op Plan:   Post-operative Plan:   Informed Consent: I have reviewed the patients History and Physical, chart, labs and discussed the procedure including the risks, benefits and alternatives for the proposed anesthesia with the patient or authorized representative who has indicated his/her understanding and acceptance.     Plan Discussed with:   Anesthesia Plan Comments:         Anesthesia Quick Evaluation

## 2012-07-10 LAB — CBC
Hemoglobin: 10.7 g/dL — ABNORMAL LOW (ref 12.0–15.0)
Platelets: 195 10*3/uL (ref 150–400)
RBC: 3.45 MIL/uL — ABNORMAL LOW (ref 3.87–5.11)
WBC: 10.4 10*3/uL (ref 4.0–10.5)

## 2012-07-10 MED ORDER — COMPLETENATE 29-1 MG PO CHEW
1.0000 | CHEWABLE_TABLET | Freq: Every day | ORAL | Status: DC
  Administered 2012-07-10: 1 via ORAL
  Filled 2012-07-10 (×2): qty 1

## 2012-07-10 NOTE — Clinical Social Work Maternal (Signed)
    Clinical Social Work Department PSYCHOSOCIAL ASSESSMENT - MATERNAL/CHILD 07/10/2012  Patient:  Janet Rodriguez, Janet Rodriguez  Account Number:  [id number]  Admit Date:  07/09/2012  Marjo Bicker Name:   Floreen Comber    Clinical Social Worker:  Nobie Putnam, LCSW   Date/Time:  07/10/2012 03:02 PM  Date Referred:  07/10/2012   Referral source  CN     Referred reason  Substance Abuse   Other referral source:    I:  FAMILY / HOME ENVIRONMENT Child's legal guardian:  PARENT  Guardian - Name Guardian - Age Guardian - Address  Janet Rodriguez [address]. [place]; [place], [place] [address]  [address]    Other household support members/support persons Name Relationship DOB   SON 2014   Other support:    II  PSYCHOSOCIAL DATA Information Source:  Patient Interview  Event organiser Employment:   Financial resources:  [employer] If Medicaid - County:  [place] Other  Sales executive  WIC   School / Grade:   Maternity Care Coordinator / Child Services Coordination / Early Interventions:  Cultural issues impacting care:    III  STRENGTHS Strengths  Adequate Resources  Home prepared for Child (including basic supplies)  Supportive family/friends   Strength comment:    IV  RISK FACTORS AND CURRENT PROBLEMS Current Problem:  YES   Risk Factor & Current Problem Patient Issue Family Issue Risk Factor / Current Problem Comment  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  Pt lives alone with her children.  She was accompanied by the FOB & an unknown female.  Pt did not seem interested in speaking with CSW & appeared to be upset.  CSW inquired about pt MJ use.  She admitted to smoking MJ "whenever wanted to."  Pt told CSW that she is stressed out & MJ , helps to calm her down.  She declined to elaborate on the source of her stress at this time.  CSW offered to make a  referral to a counselor & she accepted. CSW explained hospital drug testing policy & informed her that a CPS report would be made due to +UDS.  Pt expressed concern about CPS involvement, as she is worried that her children will be removed.  She does not have a history with CPS.  She has all the necessary supplies for the infant. CSW is concerned about pt's current stress level & encouraged her to call this CSW, if/when she would like to talk further.  Pt is at high risk of experiencing PP depression.  CSW will continue to follow pt & provide services if necessary.      VI SOCIAL WORK PLAN Social Work Plan  Child Management consultant Report  No Further Intervention Required / No Barriers to Discharge   Type of pt/family education:   If child protective services report - county:  [place] If child protective services report - date:       07/10/12 Information/referral to community resources comment:   Other social work plan:

## 2012-07-10 NOTE — Progress Notes (Signed)
UR completed 

## 2012-07-10 NOTE — Progress Notes (Signed)
Patient ID: Janet Rodriguez, female   DOB: January 23, 1991, 22 y.o.   MRN: [id number] Postpartum day one Vital signs normal Fundus firm Lochia moderate Legs negative

## 2012-07-10 NOTE — Plan of Care (Signed)
Problem: Discharge Progression Outcomes Goal: Barriers To Progression Addressed/Resolved Outcome: Completed/Met Date Met:  07/10/12 CPS referral by Social Worker to visit home after dicharge due to baby's + drug screen for [facility]. Goal: Discharge plan in place and appropriate Outcome: Completed/Met Date Met:  07/10/12 Pt. To call Dr. Elsie Stain office in am for post partum checkup and pain meds.

## 2012-07-10 NOTE — Anesthesia Postprocedure Evaluation (Signed)
  Anesthesia Post-op Note  Patient: Janet Rodriguez  Procedure(s) Performed: * No procedures listed *  Patient Location: Mother/Baby  Anesthesia Type:Epidural  Level of Consciousness: awake, alert  and oriented  Airway and Oxygen Therapy: Patient Spontanous Breathing  Post-op Pain: none  Post-op Assessment: Post-op Vital signs reviewed, Patient's Cardiovascular Status Stable, No headache, No backache, No residual numbness and No residual motor weakness  Post-op Vital Signs: Reviewed and stable  Complications: No apparent anesthesia complications

## 2012-07-10 NOTE — Discharge Summary (Signed)
Obstetric Discharge Summary Reason for Admission: induction of labor Prenatal Procedures: none Intrapartum Procedures: spontaneous vaginal delivery Postpartum Procedures: none Complications-Operative and Postpartum: none Hemoglobin  Date Value Range Status  07/10/2012 10.7* 12.0 - 15.0 g/dL Final     HCT  Date Value Range Status  07/10/2012 31.6* 36.0 - 46.0 % Final    Physical Exam:  General: alert Lochia: appropriate Uterine Fundus: firm Incision: healing well DVT Evaluation: No evidence of DVT seen on physical exam.  Discharge Diagnoses: Term Pregnancy-delivered  Discharge Information: Date: 07/10/2012 Activity: pelvic rest Diet: routine Medications: None Condition: stable Instructions: refer to practice specific booklet Discharge to: home Follow-up Information   Follow up with Loretto Belinsky A, MD. Schedule an appointment as soon as possible for a visit in 6 weeks.   Contact information:   [address] [place] [address] [phone]       Newborn Data: Live born female  Birth Weight: 6 lb 4.5 oz (2849 g) APGAR: 9, 9  Home with mother.  Samika Vetsch A 07/10/2012, 4:29 PM

## 2012-07-10 NOTE — Progress Notes (Signed)
Patient ID: Janet Rodriguez, female   DOB: 1990-03-21, 22 y.o.   MRN: [id number] Patient desires early discharged she feels fine and has no problems she can go home tonight

## 2012-11-18 ENCOUNTER — Emergency Department (HOSPITAL_COMMUNITY)
Admission: EM | Admit: 2012-11-18 | Discharge: 2012-11-18 | Disposition: A | Discharge status: Home / Self Care | Payer: Medicaid Other | Attending: Emergency Medicine | Admitting: Emergency Medicine

## 2012-11-18 ENCOUNTER — Encounter (HOSPITAL_COMMUNITY): Payer: Self-pay | Admitting: Emergency Medicine

## 2012-11-18 DIAGNOSIS — K089 Disorder of teeth and supporting structures, unspecified: Secondary | ICD-10-CM | POA: Insufficient documentation

## 2012-11-18 DIAGNOSIS — K0889 Other specified disorders of teeth and supporting structures: Secondary | ICD-10-CM

## 2012-11-18 DIAGNOSIS — K029 Dental caries, unspecified: Secondary | ICD-10-CM | POA: Insufficient documentation

## 2012-11-18 DIAGNOSIS — Z87891 Personal history of nicotine dependence: Secondary | ICD-10-CM | POA: Insufficient documentation

## 2012-11-18 NOTE — ED Notes (Signed)
Pt. reports right lower molar pain onset today unrelieved by OTC Tylenol .

## 2012-11-18 NOTE — ED Provider Notes (Signed)
CSN: [id number]     Arrival date & time 11/18/12  0208 History   First MD Initiated Contact with Patient 11/18/12 0229     Chief Complaint  Patient presents with  . Dental Pain   HPI  History provided by the patient. The patient is a 22 year old female who presents with complaints of acutely worsened right lower molar pain tonight this morning. Patient reports pain began around midnight. She did take one Tylenol with only minimal improvements of pain. She reports having some issues of pain with the same tooth in the past. She currently does not have dental insurance or a dentist for evaluation. She denies any swelling of the mouth or gums. Denies any other aggravating or alleviating factors. No associated fever, chills or sweats. No other associated symptoms.    Past Medical History  Diagnosis Date  . Headache(784.0)   . Infection    Past Surgical History  Procedure Laterality Date  . No past surgeries     No family history on file. History  Substance Use Topics  . Smoking status: Former Smoker -- 0.25 packs/day for 10 years    Types: Cigarettes    Quit date: 10/28/2011  . Smokeless tobacco: Former Neurosurgeon    Quit date: 03/11/2012  . Alcohol Use: No   OB History   Grav Para Term Preterm Abortions TAB SAB Ect Mult Living   2 2 2       2      Review of Systems  Constitutional: Negative for fever, chills and diaphoresis.  All other systems reviewed and are negative.    Allergies  Review of patient's allergies indicates no known allergies.  Home Medications   Current Outpatient Rx  Name  Route  Sig  Dispense  Refill  . acetaminophen (TYLENOL) 500 MG tablet   Oral   Take 1,500 mg by mouth every 6 (six) hours as needed for pain.          BP 116/79  Pulse 66  Temp(Src) 98.7 F (37.1 C) (Oral)  Resp 18  Wt 127 lb (57.607 kg)  BMI 21.79 kg/m2  SpO2 100%  LMP 10/27/2012 Physical Exam  Nursing note and vitals reviewed. Constitutional: She is oriented to person,  place, and time. She appears well-developed and well-nourished. No distress.  HENT:  Head: Normocephalic.  Mouth/Throat:    Significant decay of the right lower first molar. There is pain over this area. No swelling of the gums. No swelling of the tongue or pain.  Neck: Normal range of motion.  Cardiovascular: Normal rate and regular rhythm.   Pulmonary/Chest: Breath sounds normal. No stridor. She is in respiratory distress. She has no wheezes.  Abdominal: Soft.  Musculoskeletal: Normal range of motion.  Lymphadenopathy:    She has no cervical adenopathy.  Neurological: She is alert and oriented to person, place, and time.  Skin: Skin is warm and dry. No rash noted.  Psychiatric: She has a normal mood and affect. Her behavior is normal.    ED Course  Procedures   Dental Block Performed by: Angus Seller Authorized by: Angus Seller Consent: Verbal consent obtained. Risks and benefits: risks, benefits and alternatives were discussed Consent given by: patient Patient identity confirmed: provided demographic data  Location: Right lower for molar  Local anesthetic: Bupivacaine 0.5% with epinephrine  Anesthetic total: 1.8 ml  Irrigation method: syringe  Patient tolerance: Patient tolerated the procedure well with no immediate complications. Pain improved.    MDM   1. Pain, dental  Patient seen and evaluated. The patient appears well and sleeping upon entering the room. Does not appear in significant discomfort.    Angus Seller, PA-C 11/18/12 [phone]

## 2012-11-18 NOTE — ED Provider Notes (Signed)
Medical screening examination/treatment/procedure(s) were performed by non-physician practitioner and as supervising physician I was immediately available for consultation/collaboration.   Loren Racer, MD 11/18/12 [phone]

## 2013-05-13 ENCOUNTER — Encounter (HOSPITAL_COMMUNITY): Payer: Self-pay | Admitting: Emergency Medicine

## 2013-05-13 ENCOUNTER — Emergency Department (HOSPITAL_COMMUNITY): Payer: Medicaid Other

## 2013-05-13 ENCOUNTER — Emergency Department (HOSPITAL_COMMUNITY)
Admission: EM | Admit: 2013-05-13 | Discharge: 2013-05-14 | Disposition: A | Discharge status: Home / Self Care | Payer: Medicaid Other | Attending: Emergency Medicine | Admitting: Emergency Medicine

## 2013-05-13 DIAGNOSIS — Z8619 Personal history of other infectious and parasitic diseases: Secondary | ICD-10-CM | POA: Insufficient documentation

## 2013-05-13 DIAGNOSIS — S63615A Unspecified sprain of left ring finger, initial encounter: Secondary | ICD-10-CM

## 2013-05-13 DIAGNOSIS — S63639A Sprain of interphalangeal joint of unspecified finger, initial encounter: Secondary | ICD-10-CM | POA: Insufficient documentation

## 2013-05-13 DIAGNOSIS — Z87891 Personal history of nicotine dependence: Secondary | ICD-10-CM | POA: Insufficient documentation

## 2013-05-13 DIAGNOSIS — Z8669 Personal history of other diseases of the nervous system and sense organs: Secondary | ICD-10-CM | POA: Insufficient documentation

## 2013-05-13 NOTE — ED Provider Notes (Signed)
Medical screening examination/treatment/procedure(s) were performed by non-physician practitioner and as supervising physician I was immediately available for consultation/collaboration.     Geoffery Lyonsouglas Elizebath Wever, MD 05/13/13 2350
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
## 2013-05-13 NOTE — ED Notes (Signed)
Ortho tech called 

## 2013-05-13 NOTE — ED Notes (Signed)
Pt states she injured her left ring finger earlier today

## 2013-05-13 NOTE — Progress Notes (Signed)
Orthopedic Tech Progress Note Patient Details:  Janet Rodriguez 02/11/1991 [id number]  Ortho Devices Type of Ortho Device: Finger splint   Haskell Flirtewsome, Davell Beckstead M 05/13/2013, 11:53 PM
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
## 2013-05-13 NOTE — Discharge Instructions (Signed)
Take up to 800mg of ibuprofen three times a day for the next 3-4 days (take with food).  Ice 3 times a day for 15-20 minutes.  Elevate when possible to decrease swelling and pain.  Activity as tolerated.  You may return to the ER if your pain worsens or you have any other concerns.   °

## 2013-05-14 MED ORDER — IBUPROFEN 800 MG PO TABS: 800.0000 mg | ORAL_TABLET | Freq: Three times a day (TID) | ORAL | Status: DC

## 2013-09-19 ENCOUNTER — Emergency Department (HOSPITAL_COMMUNITY)
Admission: EM | Admit: 2013-09-19 | Discharge: 2013-09-19 | Disposition: A | Discharge status: Home / Self Care | Payer: Medicaid Other | Attending: Emergency Medicine | Admitting: Emergency Medicine

## 2013-09-19 ENCOUNTER — Encounter (HOSPITAL_COMMUNITY): Payer: Self-pay | Admitting: Emergency Medicine

## 2013-09-19 DIAGNOSIS — K029 Dental caries, unspecified: Secondary | ICD-10-CM | POA: Insufficient documentation

## 2013-09-19 DIAGNOSIS — K089 Disorder of teeth and supporting structures, unspecified: Secondary | ICD-10-CM | POA: Insufficient documentation

## 2013-09-19 DIAGNOSIS — K0381 Cracked tooth: Secondary | ICD-10-CM | POA: Diagnosis not present

## 2013-09-19 DIAGNOSIS — Z8619 Personal history of other infectious and parasitic diseases: Secondary | ICD-10-CM | POA: Insufficient documentation

## 2013-09-19 DIAGNOSIS — Z87891 Personal history of nicotine dependence: Secondary | ICD-10-CM | POA: Diagnosis not present

## 2013-09-19 MED ORDER — BUPIVACAINE HCL 0.5 % IJ SOLN
1.8000 mL | Freq: Once | INTRAMUSCULAR | Status: DC
  Filled 2013-09-19: qty 1.8

## 2013-09-19 MED ORDER — TRAMADOL HCL 50 MG PO TABS: 50.0000 mg | ORAL_TABLET | Freq: Four times a day (QID) | ORAL | Status: DC | PRN

## 2013-09-19 MED ORDER — BUPIVACAINE-EPINEPHRINE (PF) 0.5% -1:200000 IJ SOLN: 1.9000 mL | Freq: Once | INTRAMUSCULAR | Status: DC

## 2013-09-19 MED ORDER — BUPIVACAINE-EPINEPHRINE (PF) 0.5% -1:200000 IJ SOLN
1.8000 mL | Freq: Once | INTRAMUSCULAR | Status: AC
Start: 2013-09-19 — End: 2013-09-19
  Administered 2013-09-19: 1.8 mL

## 2013-09-19 MED ORDER — BUPIVACAINE HCL 0.5 % IJ SOLN
5.0000 mL | Freq: Once | INTRAMUSCULAR | Status: DC
  Filled 2013-09-19: qty 5

## 2013-09-19 NOTE — ED Notes (Signed)
O Mally PA  at bedside.

## 2013-09-19 NOTE — ED Provider Notes (Signed)
CSN: [id number]     Arrival date & time 09/19/13  1011 History  This chart was scribed for non-physician practitioner working with Layla Maw Ward, DO, by Modena Jansky, ED Scribe. This patient was seen in room TR08C/TR08C and the patient's care was started at 11:28 AM   Chief Complaint  Patient presents with  . Dental Pain   The history is provided by the patient. No language interpreter was used.   HPI Comments: Janet Rodriguez is a 23 y.o. female who presents to the Emergency Department complaining of constant moderate upper mouth pain that started yesterday. She reports that she had an oral shot for pain about 1 year ago and that she needs another one as pain resolved until just recently. She states that she did not see her dentist. She describes the pain as an achy feeling, 10/10. She reports that the pain radiates to her jaw. She states that she took tylenol with no relief. She report no known allergies to medication. She denies any fever, nausea, emesis, or diarrhea.   Past Medical History  Diagnosis Date  . Headache(784.0)   . Infection    Past Surgical History  Procedure Laterality Date  . No past surgeries     No family history on file. History  Substance Use Topics  . Smoking status: Former Smoker -- 0.25 packs/day for 10 years    Types: Cigarettes    Quit date: 10/28/2011  . Smokeless tobacco: Former Neurosurgeon    Quit date: 03/11/2012  . Alcohol Use: No   OB History   Grav Para Term Preterm Abortions TAB SAB Ect Mult Living   2 2 2       2      Review of Systems  HENT: Positive for dental problem.   All other systems reviewed and are negative.   Allergies  Review of patient's allergies indicates no known allergies.  Home Medications   Prior to Admission medications   Medication Sig Start Date End Date Taking? Authorizing Provider  acetaminophen (TYLENOL) 500 MG tablet Take 1,500 mg by mouth every 6 (six) hours as needed for mild pain.   Yes Historical Provider, MD   traMADol (ULTRAM) 50 MG tablet Take 1 tablet (50 mg total) by mouth every 6 (six) hours as needed. 09/19/13   Junius Finner, PA-C   BP 122/77  Pulse 75  Temp(Src) 98.2 F (36.8 C) (Oral)  Resp 18  Ht 5\' 5"  (1.651 m)  Wt 108 lb (48.988 kg)  BMI 17.97 kg/m2  SpO2 100%  LMP 09/18/2013 Physical Exam  Nursing note and vitals reviewed. Constitutional: She is oriented to person, place, and time. She appears well-developed and well-nourished.  HENT:  Head: Normocephalic and atraumatic.  Top upper second to last molar cracked tooth with filling in place. No gingival abscess. Tooth is tender to touch.  Eyes: EOM are normal.  Neck: Normal range of motion.  Cardiovascular: Normal rate.   Pulmonary/Chest: Effort normal.  Musculoskeletal: Normal range of motion.  Neurological: She is alert and oriented to person, place, and time.  Skin: Skin is warm and dry.  Psychiatric: She has a normal mood and affect. Her behavior is normal.    ED Course  Procedures   NERVE BLOCK Performed by: Junius Finner A. Consent: Verbal consent obtained. Required items: required blood products, implants, devices, and special equipment available Time out: Immediately prior to procedure a "time out" was called to verify the correct patient, procedure, equipment, support staff and site/side marked  as required.  Indication: dental pain Nerve block body site: posterior superior alveolar block  Preparation: Patient was prepped and draped in the usual sterile fashion. Needle gauge: 27 G Location technique: anatomical landmarks  Local anesthetic: bupivacaine  Anesthetic total: 1 ml  Outcome: pain improved Patient tolerance: Patient tolerated the procedure well with no immediate complications.   DIAGNOSTIC STUDIES: Oxygen Saturation is 100% on RA, normal by my interpretation.    COORDINATION OF CARE: 11:32 AM- Pt advised of plan for treatment which includes medication and pt agrees.  Labs Review Labs  Reviewed - No data to display  Imaging Review No results found.   EKG Interpretation None      MDM   Final diagnoses:  Pain due to dental caries  Cracked tooth    Pt is a 23yo female presenting to ED with c/o dental pain due to a cracked tooth. Requested dental block. Dental block successfully performed with improvement in pain.  No dental abscess seen on exam.  Will discharge home. Advised to f/u with Dr. Mayford Knifeurner, DDS, as well as provided dental resource guide. Home care instructions provided. Return precautions provided. Pt verbalized understanding and agreement with tx plan.   I personally performed the services described in this documentation, which was scribed in my presence. The recorded information has been reviewed and is accurate.     Junius FinnerErin O'Malley, PA-C 09/19/13 1622

## 2013-09-19 NOTE — Discharge Instructions (Signed)
Dental Care and Dentist Visits °Dental care supports good overall health. Regular dental visits can also help you avoid dental pain, bleeding, infection, and other more serious health problems in the future. It is important to keep the mouth healthy because diseases in the teeth, gums, and other oral tissues can spread to other areas of the body. Some problems, such as diabetes, heart disease, and pre-term labor have been associated with poor oral health.  °See your dentist every 6 months. If you experience emergency problems such as a toothache or broken tooth, go to the dentist right away. If you see your dentist regularly, you may catch problems early. It is easier to be treated for problems in the early stages.  °WHAT TO EXPECT AT A DENTIST VISIT  °Your dentist will look for many common oral health problems and recommend proper treatment. At your regular dental visit, you can expect: °· Gentle cleaning of the teeth and gums. This includes scraping and polishing. This helps to remove the sticky substance around the teeth and gums (plaque). Plaque forms in the mouth shortly after eating. Over time, plaque hardens on the teeth as tartar. If tartar is not removed regularly, it can cause problems. Cleaning also helps remove stains. °· Periodic X-rays. These pictures of the teeth and supporting bone will help your dentist assess the health of your teeth. °· Periodic fluoride treatments. Fluoride is a natural mineral shown to help strengthen teeth. Fluoride treatment involves applying a fluoride gel or varnish to the teeth. It is most commonly done in children. °· Examination of the mouth, tongue, jaws, teeth, and gums to look for any oral health problems, such as: °· Cavities (dental caries). This is decay on the tooth caused by plaque, sugar, and acid in the mouth. It is best to catch a cavity when it is small. °· Inflammation of the gums caused by plaque buildup (gingivitis). °· Problems with the mouth or malformed  or misaligned teeth. °· Oral cancer or other diseases of the soft tissues or jaws.  °KEEP YOUR TEETH AND GUMS HEALTHY °For healthy teeth and gums, follow these general guidelines as well as your dentist's specific advice: °· Have your teeth professionally cleaned at the dentist every 6 months. °· Brush twice daily with a fluoride toothpaste. °· Floss your teeth daily.  °· Ask your dentist if you need fluoride supplements, treatments, or fluoride toothpaste. °· Eat a healthy diet. Reduce foods and drinks with added sugar. °· Avoid smoking. °TREATMENT FOR ORAL HEALTH PROBLEMS °If you have oral health problems, treatment varies depending on the conditions present in your teeth and gums. °· Your caregiver will most likely recommend good oral hygiene at each visit. °· For cavities, gingivitis, or other oral health disease, your caregiver will perform a procedure to treat the problem. This is typically done at a separate appointment. Sometimes your caregiver will refer you to another dental specialist for specific tooth problems or for surgery. °SEEK IMMEDIATE DENTAL CARE IF: °· You have pain, bleeding, or soreness in the gum, tooth, jaw, or mouth area. °· A permanent tooth becomes loose or separated from the gum socket. °· You experience a blow or injury to the mouth or jaw area. °Document Released: 10/11/2010 Document Revised: 04/23/2011 Document Reviewed: 10/11/2010 °[employer]® Patient Information ©2015 [employer]. This information is not intended to replace advice given to you by your health care provider. Make sure you discuss any questions you have with your health care provider. ° °Dental Caries °Dental caries is tooth decay. This   decay can cause a hole in teeth (cavity) that can get bigger and deeper over time. °HOME CARE °· Brush and floss your teeth. Do this at least two times a day. °· Use a fluoride toothpaste. °· Use a mouth rinse if told by your dentist or doctor. °· Eat less sugary and starchy foods.  Drink less sugary drinks. °· Avoid snacking often on sugary and starchy foods. Avoid sipping often on sugary drinks. °· Keep regular checkups and cleanings with your dentist. °· Use fluoride supplements if told by your dentist or doctor. °· Allow fluoride to be applied to teeth if told by your dentist or doctor. °Document Released: 11/08/2007 Document Revised: 06/15/2013 Document Reviewed: 02/01/2012 °[employer]® Patient Information ©2015 [employer]. This information is not intended to replace advice given to you by your health care provider. Make sure you discuss any questions you have with your health care provider. ° °

## 2013-09-19 NOTE — ED Notes (Signed)
Broken tooth lt. Upper molar, pain began yesterday

## 2013-09-19 NOTE — ED Provider Notes (Signed)
Medical screening examination/treatment/procedure(s) were performed by non-physician practitioner and as supervising physician I was immediately available for consultation/collaboration.   EKG Interpretation None        Layla MawKristen N Ward, DO 09/19/13 1624

## 2013-10-14 LAB — CYTOLOGY - PAP: Pap: NEGATIVE

## 2013-12-14 ENCOUNTER — Encounter (HOSPITAL_COMMUNITY): Payer: Self-pay | Admitting: Emergency Medicine

## 2014-03-01 ENCOUNTER — Inpatient Hospital Stay (HOSPITAL_COMMUNITY)
Admission: AD | Admit: 2014-03-01 | Discharge: 2014-03-01 | Disposition: A | Discharge status: Home / Self Care | Payer: Medicaid Other | Source: Ambulatory Visit | Attending: Obstetrics | Admitting: Obstetrics

## 2014-03-01 ENCOUNTER — Encounter (HOSPITAL_COMMUNITY): Payer: Self-pay | Admitting: *Deleted

## 2014-03-01 DIAGNOSIS — Z87891 Personal history of nicotine dependence: Secondary | ICD-10-CM | POA: Diagnosis not present

## 2014-03-01 DIAGNOSIS — R0981 Nasal congestion: Secondary | ICD-10-CM | POA: Diagnosis present

## 2014-03-01 DIAGNOSIS — J069 Acute upper respiratory infection, unspecified: Secondary | ICD-10-CM | POA: Diagnosis not present

## 2014-03-01 DIAGNOSIS — Z3202 Encounter for pregnancy test, result negative: Secondary | ICD-10-CM | POA: Diagnosis present

## 2014-03-01 HISTORY — DX: Herpesviral infection, unspecified: B00.9

## 2014-03-01 LAB — URINALYSIS, ROUTINE W REFLEX MICROSCOPIC
Bilirubin Urine: NEGATIVE
Glucose, UA: NEGATIVE mg/dL
Hgb urine dipstick: NEGATIVE
Ketones, ur: NEGATIVE mg/dL
LEUKOCYTES UA: NEGATIVE
NITRITE: NEGATIVE
Protein, ur: NEGATIVE mg/dL
Specific Gravity, Urine: 1.01 (ref 1.005–1.030)
Urobilinogen, UA: 0.2 mg/dL (ref 0.0–1.0)
pH: 6 (ref 5.0–8.0)

## 2014-03-01 LAB — POCT PREGNANCY, URINE: Preg Test, Ur: NEGATIVE

## 2014-03-01 NOTE — MAU Note (Signed)
I feel sick, don't feel good.  Congested, hard to breath at night.  Feels like everything is draining down.  Nauseated. Peeing a lot, no pain or burning associated with it.

## 2014-03-01 NOTE — MAU Note (Signed)
Pt asking about preg test, she had done one last wk, but thought it was probably too early, explained may still be too early. Has not missed a period yet, not even due. Then asked if we would check blood?

## 2014-03-01 NOTE — MAU Provider Note (Signed)
History     CSN: [id number]  Arrival date and time: 03/01/14 1016   None     Chief Complaint  Patient presents with  . Nasal Congestion   HPI 24 year old G2 P2 002 with Theone Stanleyllen P of 02/06/2014. Patient was seen that she was pregnant and desired a pregnancy test. Additionally, she has upper respiratory symptoms of nasal congestion that started about a week ago. Symptoms have been improving with Tylenol Sinus and cold although continues to have some nasal congestion particularly at night. Lying down makes her symptoms worse. Sitting up improves her symptoms.   OB History    Gravida Para Term Preterm AB TAB SAB Ectopic Multiple Living   2 2 2       2       Past Medical History  Diagnosis Date  . Headache(784.0)   . Infection   . Herpes     Past Surgical History  Procedure Laterality Date  . No past surgeries      Family History  Problem Relation Age of Onset  . Cancer Maternal Grandfather   . Cancer Paternal Grandmother     History  Substance Use Topics  . Smoking status: Former Smoker -- 0.25 packs/day for 10 years    Types: Cigarettes    Quit date: 10/28/2011  . Smokeless tobacco: Former NeurosurgeonUser    Quit date: 03/11/2012  . Alcohol Use: No    Allergies: No Known Allergies  Prescriptions prior to admission  Medication Sig Dispense Refill Last Dose  . traMADol (ULTRAM) 50 MG tablet Take 1 tablet (50 mg total) by mouth every 6 (six) hours as needed. (Patient not taking: Reported on 03/01/2014) 15 tablet 0     Review of Systems  Constitutional: Negative for fever, chills and weight loss.  HENT: Positive for congestion. Negative for ear pain, hearing loss, sore throat and tinnitus.   Cardiovascular: Negative for chest pain, palpitations and orthopnea.  Gastrointestinal: Negative for heartburn, nausea, vomiting, abdominal pain, diarrhea, constipation and blood in stool.  Genitourinary: Negative for dysuria, urgency and frequency.  Neurological: Negative for  headaches.   Physical Exam   Blood pressure 112/70, pulse 91, temperature 98.9 F (37.2 C), temperature source Oral, resp. rate 18, height 5' 4.75" (1.645 m), weight 108 lb (48.988 kg), last menstrual period 02/06/2014, SpO2 100 %.  Physical Exam  Constitutional: She is oriented to person, place, and time. She appears well-developed and well-nourished.  HENT:  Head: Normocephalic and atraumatic.  Right Ear: External ear normal.  Left Ear: External ear normal.  Mouth/Throat: Oropharynx is clear and moist. No oropharyngeal exudate.  Eyes: Conjunctivae are normal. Pupils are equal, round, and reactive to light. Right eye exhibits no discharge. Left eye exhibits no discharge. No scleral icterus.  Cardiovascular: Normal rate, regular rhythm and normal heart sounds.   Respiratory: Effort normal and breath sounds normal. No respiratory distress. She has no wheezes. She has no rales. She exhibits no tenderness.  Neurological: She is alert and oriented to person, place, and time.  Skin: Skin is warm and dry.  Psychiatric: She has a normal mood and affect. Her behavior is normal. Judgment and thought content normal.   Results for orders placed or performed during the hospital encounter of 03/01/14 (from the past 24 hour(s))  Urinalysis, Routine w reflex microscopic     Status: None   Collection Time: 03/01/14 10:48 AM  Result Value Ref Range   Color, Urine YELLOW YELLOW   APPearance CLEAR CLEAR   Specific Gravity,  Urine 1.010 1.005 - 1.030   pH 6.0 5.0 - 8.0   Glucose, UA NEGATIVE NEGATIVE mg/dL   Hgb urine dipstick NEGATIVE NEGATIVE   Bilirubin Urine NEGATIVE NEGATIVE   Ketones, ur NEGATIVE NEGATIVE mg/dL   Protein, ur NEGATIVE NEGATIVE mg/dL   Urobilinogen, UA 0.2 0.0 - 1.0 mg/dL   Nitrite NEGATIVE NEGATIVE   Leukocytes, UA NEGATIVE NEGATIVE  Pregnancy, urine POC     Status: None   Collection Time: 03/01/14 11:06 AM  Result Value Ref Range   Preg Test, Ur NEGATIVE NEGATIVE     MAU  Course  Procedures   Assessment and Plan  1.  URI  Symptomatic treatment discussed with patient - mucinex, ibuprofen, etc.  F/u if not improving or if has fever.  STINSON, JACOB JEHIEL 03/01/2014, 12:59 PM

## 2014-03-01 NOTE — Discharge Instructions (Signed)
Upper Respiratory Infection, Adult An upper respiratory infection (URI) is also sometimes known as the common cold. The upper respiratory tract includes the nose, sinuses, throat, trachea, and bronchi. Bronchi are the airways leading to the lungs. Most people improve within 1 week, but symptoms can last up to 2 weeks. A residual cough may last even longer.  CAUSES Many different viruses can infect the tissues lining the upper respiratory tract. The tissues become irritated and inflamed and often become very moist. Mucus production is also common. A cold is contagious. You can easily spread the virus to others by oral contact. This includes kissing, sharing a glass, coughing, or sneezing. Touching your mouth or nose and then touching a surface, which is then touched by another person, can also spread the virus. SYMPTOMS  Symptoms typically develop 1 to 3 days after you come in contact with a cold virus. Symptoms vary from person to person. They may include:  Runny nose.  Sneezing.  Nasal congestion.  Sinus irritation.  Sore throat.  Loss of voice (laryngitis).  Cough.  Fatigue.  Muscle aches.  Loss of appetite.  Headache.  Low-grade fever. DIAGNOSIS  You might diagnose your own cold based on familiar symptoms, since most people get a cold 2 to 3 times a year. Your caregiver can confirm this based on your exam. Most importantly, your caregiver can check that your symptoms are not due to another disease such as strep throat, sinusitis, pneumonia, asthma, or epiglottitis. Blood tests, throat tests, and X-rays are not necessary to diagnose a common cold, but they may sometimes be helpful in excluding other more serious diseases. Your caregiver will decide if any further tests are required. RISKS AND COMPLICATIONS  You may be at risk for a more severe case of the common cold if you smoke cigarettes, have chronic heart disease (such as heart failure) or lung disease (such as asthma), or if  you have a weakened immune system. The very young and very old are also at risk for more serious infections. Bacterial sinusitis, middle ear infections, and bacterial pneumonia can complicate the common cold. The common cold can worsen asthma and chronic obstructive pulmonary disease (COPD). Sometimes, these complications can require emergency medical care and may be life-threatening. PREVENTION  The best way to protect against getting a cold is to practice good hygiene. Avoid oral or hand contact with people with cold symptoms. Wash your hands often if contact occurs. There is no clear evidence that vitamin C, vitamin E, echinacea, or exercise reduces the chance of developing a cold. However, it is always recommended to get plenty of rest and practice good nutrition. TREATMENT  Treatment is directed at relieving symptoms. There is no cure. Antibiotics are not effective, because the infection is caused by a virus, not by bacteria. Treatment may include:  Increased fluid intake. Sports drinks offer valuable electrolytes, sugars, and fluids.  Breathing heated mist or steam (vaporizer or shower).  Eating chicken soup or other clear broths, and maintaining good nutrition.  Getting plenty of rest.  Using gargles or lozenges for comfort.  Controlling fevers with ibuprofen or acetaminophen as directed by your caregiver.  Increasing usage of your inhaler if you have asthma. Zinc gel and zinc lozenges, taken in the first 24 hours of the common cold, can shorten the duration and lessen the severity of symptoms. Pain medicines may help with fever, muscle aches, and throat pain. A variety of non-prescription medicines are available to treat congestion and runny nose. Your caregiver   can make recommendations and may suggest nasal or lung inhalers for other symptoms.  HOME CARE INSTRUCTIONS   Only take over-the-counter or prescription medicines for pain, discomfort, or fever as directed by your  caregiver.  Use a warm mist humidifier or inhale steam from a shower to increase air moisture. This may keep secretions moist and make it easier to breathe.  Drink enough water and fluids to keep your urine clear or pale yellow.  Rest as needed.  Return to work when your temperature has returned to normal or as your caregiver advises. You may need to stay home longer to avoid infecting others. You can also use a face mask and careful hand washing to prevent spread of the virus. SEEK MEDICAL CARE IF:   After the first few days, you feel you are getting worse rather than better.  You need your caregiver's advice about medicines to control symptoms.  You develop chills, worsening shortness of breath, or brown or red sputum. These may be signs of pneumonia.  You develop yellow or brown nasal discharge or pain in the face, especially when you bend forward. These may be signs of sinusitis.  You develop a fever, swollen neck glands, pain with swallowing, or Podoll areas in the back of your throat. These may be signs of strep throat. SEEK IMMEDIATE MEDICAL CARE IF:   You have a fever.  You develop severe or persistent headache, ear pain, sinus pain, or chest pain.  You develop wheezing, a prolonged cough, cough up blood, or have a change in your usual mucus (if you have chronic lung disease).  You develop sore muscles or a stiff neck. Document Released: 07/25/2000 Document Revised: 04/23/2011 Document Reviewed: 05/06/2013 Exit[employer] Patient Information 2015 [employer]. This information is not intended to replace advice given to you by your health care provider. Make sure you discuss any questions you have with your health care provider.  

## 2014-04-16 ENCOUNTER — Emergency Department (INDEPENDENT_AMBULATORY_CARE_PROVIDER_SITE_OTHER)
Admission: EM | Admit: 2014-04-16 | Discharge: 2014-04-16 | Disposition: A | Discharge status: Home / Self Care | Payer: Medicaid Other | Source: Home / Self Care | Attending: Emergency Medicine | Admitting: Emergency Medicine

## 2014-04-16 ENCOUNTER — Encounter (HOSPITAL_COMMUNITY): Payer: Self-pay | Admitting: Emergency Medicine

## 2014-04-16 DIAGNOSIS — J111 Influenza due to unidentified influenza virus with other respiratory manifestations: Secondary | ICD-10-CM | POA: Diagnosis not present

## 2014-04-16 DIAGNOSIS — R69 Illness, unspecified: Principal | ICD-10-CM

## 2014-04-16 LAB — POCT URINALYSIS DIP (DEVICE)
Glucose, UA: NEGATIVE mg/dL
HGB URINE DIPSTICK: NEGATIVE
Ketones, ur: >=160 mg/dL — AB
Leukocytes, UA: NEGATIVE
Nitrite: NEGATIVE
Protein, ur: NEGATIVE mg/dL
SPECIFIC GRAVITY, URINE: 1.025 (ref 1.005–1.030)
UROBILINOGEN UA: 1 mg/dL (ref 0.0–1.0)
pH: 6 (ref 5.0–8.0)

## 2014-04-16 LAB — POCT PREGNANCY, URINE: Preg Test, Ur: NEGATIVE

## 2014-04-16 MED ORDER — ONDANSETRON HCL 4 MG/2ML IJ SOLN
4.0000 mg | Freq: Once | INTRAMUSCULAR | Status: AC
  Administered 2014-04-16: 4 mg via INTRAVENOUS

## 2014-04-16 MED ORDER — KETOROLAC TROMETHAMINE 30 MG/ML IJ SOLN
30.0000 mg | Freq: Once | INTRAMUSCULAR | Status: AC
  Administered 2014-04-16: 30 mg via INTRAVENOUS

## 2014-04-16 MED ORDER — ONDANSETRON HCL 4 MG/2ML IJ SOLN: 4.0000 mg | Freq: Once | INTRAMUSCULAR | Status: DC

## 2014-04-16 MED ORDER — IBUPROFEN 800 MG PO TABS: 800.0000 mg | ORAL_TABLET | Freq: Three times a day (TID) | ORAL | Status: DC

## 2014-04-16 MED ORDER — SODIUM CHLORIDE 0.9 % IV BOLUS (SEPSIS)
1000.0000 mL | Freq: Once | INTRAVENOUS | Status: AC
  Administered 2014-04-16: 1000 mL via INTRAVENOUS

## 2014-04-16 MED ORDER — ONDANSETRON HCL 4 MG/2ML IJ SOLN
INTRAMUSCULAR | Status: AC
  Filled 2014-04-16: qty 2

## 2014-04-16 MED ORDER — KETOROLAC TROMETHAMINE 30 MG/ML IJ SOLN: 30.0000 mg | Freq: Once | INTRAMUSCULAR | Status: DC

## 2014-04-16 MED ORDER — KETOROLAC TROMETHAMINE 30 MG/ML IJ SOLN
INTRAMUSCULAR | Status: AC
  Filled 2014-04-16: qty 1

## 2014-04-16 MED ORDER — OSELTAMIVIR PHOSPHATE 75 MG PO CAPS: 75.0000 mg | ORAL_CAPSULE | Freq: Two times a day (BID) | ORAL | Status: DC

## 2014-04-16 MED ORDER — ONDANSETRON 8 MG PO TBDP: 8.0000 mg | ORAL_TABLET | Freq: Three times a day (TID) | ORAL | Status: DC | PRN

## 2014-04-16 NOTE — ED Provider Notes (Signed)
CSN: [id number]     Arrival date & time 04/16/14  1742 History   First MD Initiated Contact with Patient 04/16/14 1807     Chief Complaint  Patient presents with  . Influenza   (Consider location/radiation/quality/duration/timing/severity/associated sxs/prior Treatment) HPI         24 year old female who has a close family contact with influenza presents complaining of fever, chills, body aches, mild cough, multiple episodes of vomiting. This started this morning. Additionally she has orthostatic dizziness as well as some back pain. Temperature was 101.5F earlier today. No chest pain or shortness of breath.  Past Medical History  Diagnosis Date  . Headache(784.0)   . Infection   . Herpes    Past Surgical History  Procedure Laterality Date  . No past surgeries     Family History  Problem Relation Age of Onset  . Cancer Maternal Grandfather   . Cancer Paternal Grandmother    History  Substance Use Topics  . Smoking status: Former Smoker -- 0.25 packs/day for 10 years    Types: Cigarettes    Quit date: 10/28/2011  . Smokeless tobacco: Former Neurosurgeon    Quit date: 03/11/2012  . Alcohol Use: No   OB History    Gravida Para Term Preterm AB TAB SAB Ectopic Multiple Living   Review of Systems  Constitutional: Positive for fever, chills and fatigue.  HENT: Negative for congestion, postnasal drip, sinus pressure and sore throat.   Eyes: Negative for visual disturbance.  Respiratory: Positive for cough. Negative for shortness of breath.   Cardiovascular: Negative for chest pain.  Gastrointestinal: Positive for nausea, vomiting and abdominal pain. Negative for diarrhea.  Musculoskeletal: Positive for back pain.  Skin: Negative for rash.  Neurological: Positive for dizziness.  All other systems reviewed and are negative.   Allergies  Review of patient's allergies indicates no known allergies.  Home Medications   Prior to Admission medications   Medication  Sig Start Date End Date Taking? Authorizing Provider  ibuprofen (ADVIL,MOTRIN) 800 MG tablet Take 1 tablet (800 mg total) by mouth 3 (three) times daily. 04/16/14   Adrian Blackwater Anival Pasha, PA-C  ondansetron (ZOFRAN ODT) 8 MG disintegrating tablet Take 1 tablet (8 mg total) by mouth every 8 (eight) hours as needed for nausea or vomiting. 04/16/14   Graylon Good, PA-C  oseltamivir (TAMIFLU) 75 MG capsule Take 1 capsule (75 mg total) by mouth every 12 (twelve) hours. 04/16/14   Graylon Good, PA-C  traMADol (ULTRAM) 50 MG tablet Take 1 tablet (50 mg total) by mouth every 6 (six) hours as needed. Patient not taking: Reported on 03/01/2014 09/19/13   Junius Finner, PA-C   BP 104/62 mmHg  Pulse 78  Temp(Src) 99.6 F (37.6 C) (Oral)  Resp 18  SpO2 97%  LMP 04/04/2014 Physical Exam  Constitutional: She is oriented to person, place, and time. Vital signs are normal. She appears well-developed and well-nourished. She appears ill. No distress.  HENT:  Head: Normocephalic and atraumatic.  Right Ear: External ear normal.  Left Ear: External ear normal.  Nose: Nose normal.  Mouth/Throat: Oropharynx is clear and moist. No oropharyngeal exudate.  Neck: Normal range of motion. Neck supple.  Cardiovascular: Normal rate, regular rhythm and normal heart sounds.   Pulmonary/Chest: Effort normal and breath sounds normal. No respiratory distress.  Abdominal: Soft. Normal appearance. She exhibits no mass. There is no tenderness. There is CVA tenderness (  bilateral). There is no rebound and no guarding.  Neurological: She is alert and oriented to person, place, and time. She has normal strength. Coordination normal.  Skin: Skin is warm and dry. No rash noted. She is not diaphoretic.  Psychiatric: She has a normal mood and affect. Judgment normal.  Nursing note and vitals reviewed.   ED Course  Procedures (including critical care time) Labs Review Labs Reviewed  POCT URINALYSIS DIP (DEVICE) - Abnormal; Notable for the  following:    Bilirubin Urine SMALL (*)    Ketones, ur >=160 (*)    All other components within normal limits  POCT PREGNANCY, URINE    Imaging Review No results found.   MDM   1. Influenza-like illness    After 1L saline bolus, 4 mg IV zofran, 30 mg IV toradol, she is feeling much better.  Discharge with tamiflu, ibuprofen, zofran.  F/U PRN if no improvement in a few days    Meds ordered this encounter  Medications  . sodium chloride 0.9 % bolus 1,000 mL    Sig:   . DISCONTD: ondansetron (ZOFRAN) injection 4 mg    Sig:   . DISCONTD: ketorolac (TORADOL) 30 MG/ML injection 30 mg    Sig:   . ondansetron (ZOFRAN) injection 4 mg    Sig:   . ketorolac (TORADOL) 30 MG/ML injection 30 mg    Sig:   . ondansetron (ZOFRAN ODT) 8 MG disintegrating tablet    Sig: Take 1 tablet (8 mg total) by mouth every 8 (eight) hours as needed for nausea or vomiting.    Dispense:  12 tablet    Refill:  0  . ibuprofen (ADVIL,MOTRIN) 800 MG tablet    Sig: Take 1 tablet (800 mg total) by mouth 3 (three) times daily.    Dispense:  30 tablet    Refill:  0  . oseltamivir (TAMIFLU) 75 MG capsule    Sig: Take 1 capsule (75 mg total) by mouth every 12 (twelve) hours.    Dispense:  10 capsule    Refill:  0       Graylon GoodZachary H Tameshia Bonneville, PA-C 04/16/14 1944

## 2014-04-16 NOTE — ED Notes (Signed)
Patient reports she has flu like sx onset this morning. Fever was 101.6 F.  Patient reports she has been vomiting and unable to keep anything in today. Patient also c/o body aches. Patient is laying on the exam table and is in NAD.

## 2014-04-16 NOTE — Discharge Instructions (Signed)

## 2015-10-11 ENCOUNTER — Ambulatory Visit: Payer: Self-pay | Admitting: Certified Nurse Midwife

## 2015-10-13 ENCOUNTER — Ambulatory Visit: Payer: Self-pay | Admitting: Certified Nurse Midwife

## 2015-11-02 ENCOUNTER — Ambulatory Visit: Payer: Self-pay | Admitting: Obstetrics & Gynecology

## 2015-11-04 ENCOUNTER — Encounter: Payer: Self-pay | Admitting: *Deleted

## 2015-12-01 ENCOUNTER — Ambulatory Visit: Payer: Self-pay | Admitting: Obstetrics

## 2015-12-02 ENCOUNTER — Ambulatory Visit (HOSPITAL_COMMUNITY)
Admission: EM | Admit: 2015-12-02 | Discharge: 2015-12-02 | Disposition: A | Discharge status: Home / Self Care | Payer: Self-pay | Attending: Family Medicine | Admitting: Family Medicine

## 2015-12-02 ENCOUNTER — Encounter (HOSPITAL_COMMUNITY): Payer: Self-pay | Admitting: Emergency Medicine

## 2015-12-02 DIAGNOSIS — L301 Dyshidrosis [pompholyx]: Secondary | ICD-10-CM

## 2015-12-02 DIAGNOSIS — K120 Recurrent oral aphthae: Secondary | ICD-10-CM

## 2015-12-02 MED ORDER — LIDOCAINE VISCOUS 2 % MT SOLN: 1.0000 mL | OROMUCOSAL | 0 refills | Status: DC | PRN

## 2015-12-02 NOTE — ED Provider Notes (Signed)
CSN: [id number]     Arrival date & time 12/02/15  1049 History   First MD Initiated Contact with Patient 12/02/15 1146     Chief Complaint  Patient presents with  . Rash  . Mouth Lesions   (Consider location/radiation/quality/duration/timing/severity/associated sxs/prior Treatment) HPI   Patient is a 25 y.o. Female who presents to UC c/o sores on palms and tongue hand rash has been present for several months. Tongue sore only a couple of days.   Past Medical History:  Diagnosis Date  . Headache(784.0)   . Herpes   . Infection    Past Surgical History:  Procedure Laterality Date  . NO PAST SURGERIES     Family History  Problem Relation Age of Onset  . Cancer Maternal Grandfather   . Cancer Paternal Grandmother    Social History  Substance Use Topics  . Smoking status: Former Smoker    Packs/day: 0.25    Years: 10.00    Types: Cigarettes    Quit date: 10/28/2011  . Smokeless tobacco: Former Neurosurgeon    Quit date: 03/11/2012  . Alcohol use No   OB History    Gravida Para Term Preterm AB Living   2 2 2     2    SAB TAB Ectopic Multiple Live Births           1     Review of Systems  Denies: HEADACHE, NAUSEA, ABDOMINAL PAIN, CHEST PAIN, CONGESTION, DYSURIA, SHORTNESS OF BREATH  Allergies  Review of patient's allergies indicates no known allergies.  Home Medications   Prior to Admission medications   Medication Sig Start Date End Date Taking? Authorizing Provider  ibuprofen (ADVIL,MOTRIN) 800 MG tablet Take 1 tablet (800 mg total) by mouth 3 (three) times daily. 04/16/14   Adrian Blackwater Baker, PA-C  lidocaine (XYLOCAINE) 2 % solution Use as directed 1 mL in the mouth or throat as needed for mouth pain (Apply topically as needed). 12/02/15   Tharon Aquas, PA  ondansetron (ZOFRAN ODT) 8 MG disintegrating tablet Take 1 tablet (8 mg total) by mouth every 8 (eight) hours as needed for nausea or vomiting. 04/16/14   Graylon Good, PA-C  oseltamivir (TAMIFLU) 75 MG capsule Take 1  capsule (75 mg total) by mouth every 12 (twelve) hours. 04/16/14   Graylon Good, PA-C  traMADol (ULTRAM) 50 MG tablet Take 1 tablet (50 mg total) by mouth every 6 (six) hours as needed. Patient not taking: Reported on 03/01/2014 09/19/13   Junius Finner, PA-C   Meds Ordered and Administered this Visit  Medications - No data to display  BP 117/73 (BP Location: Left Arm)   Pulse 70   Temp 98.6 F (37 C) (Oral)   Resp 12   LMP 12/02/2015 (Exact Date)   SpO2 100%  No data found.   Physical Exam NURSES NOTES AND VITAL SIGNS REVIEWED. CONSTITUTIONAL: Well developed, well nourished, no acute distress HEENT: normocephalic, atraumatic, single canker sore on tongue. EYES: Conjunctiva normal NECK:normal ROM, supple, no adenopathy PULMONARY:No respiratory distress, normal effort ABDOMINAL: Soft, ND, NT BS+, No CVAT MUSCULOSKELETAL: Normal ROM of all extremities,  SKIN: warm and dry dyshidrotic eczema to palms of both hands without infection.  PSYCHIATRIC: Mood and affect, behavior are normal  Urgent Care Course   Clinical Course    Procedures (including critical care time)  Labs Review Labs Reviewed - No data to display  Imaging Review No results found.   Visual Acuity Review  Right Eye Distance:  Left Eye Distance:   Bilateral Distance:    Right Eye Near:   Left Eye Near:    Bilateral Near:         MDM   1. Canker sores oral   2. Dyshidrotic eczema     Patient is reassured that there are no issues that require transfer to higher level of care at this time or additional tests. Patient is advised to continue home symptomatic treatment. Patient is advised that if there are new or worsening symptoms to attend the emergency department, contact primary care provider, or return to UC. Instructions of care provided discharged home in stable condition.    THIS NOTE WAS GENERATED USING A VOICE RECOGNITION SOFTWARE PROGRAM. ALL REASONABLE EFFORTS  WERE MADE TO PROOFREAD  THIS DOCUMENT FOR ACCURACY.  I have verbally reviewed the discharge instructions with the patient. A printed AVS was given to the patient.  All questions were answered prior to discharge.      Tharon AquasFrank C Patrick, PA 12/02/15 [id number]

## 2015-12-02 NOTE — Discharge Instructions (Signed)
Try to keep feet and hands as dry as possible Apply cortisone cream as needed.

## 2015-12-02 NOTE — ED Triage Notes (Signed)
[facility].   The patient reported a rash on both hands and both feet that has been present for 2 months but has gotten worse in the last 2 days.   The patient also complained of pain and swelling on her tongue that started 2 days ago after eating a jalapeno pepper.

## 2017-02-15 ENCOUNTER — Inpatient Hospital Stay (HOSPITAL_COMMUNITY): Payer: Medicaid Other

## 2017-02-15 ENCOUNTER — Inpatient Hospital Stay (HOSPITAL_COMMUNITY)
Admission: AD | Admit: 2017-02-15 | Discharge: 2017-02-15 | Disposition: A | Discharge status: Home / Self Care | Payer: Medicaid Other | Source: Ambulatory Visit | Attending: Obstetrics & Gynecology | Admitting: Obstetrics & Gynecology

## 2017-02-15 ENCOUNTER — Encounter (HOSPITAL_COMMUNITY): Payer: Self-pay | Admitting: *Deleted

## 2017-02-15 DIAGNOSIS — Z3491 Encounter for supervision of normal pregnancy, unspecified, first trimester: Secondary | ICD-10-CM

## 2017-02-15 DIAGNOSIS — Z87891 Personal history of nicotine dependence: Secondary | ICD-10-CM | POA: Insufficient documentation

## 2017-02-15 DIAGNOSIS — N760 Acute vaginitis: Secondary | ICD-10-CM

## 2017-02-15 DIAGNOSIS — Z79899 Other long term (current) drug therapy: Secondary | ICD-10-CM | POA: Insufficient documentation

## 2017-02-15 DIAGNOSIS — O26891 Other specified pregnancy related conditions, first trimester: Secondary | ICD-10-CM

## 2017-02-15 DIAGNOSIS — B9689 Other specified bacterial agents as the cause of diseases classified elsewhere: Secondary | ICD-10-CM

## 2017-02-15 DIAGNOSIS — O209 Hemorrhage in early pregnancy, unspecified: Secondary | ICD-10-CM

## 2017-02-15 DIAGNOSIS — Z3A01 Less than 8 weeks gestation of pregnancy: Secondary | ICD-10-CM | POA: Diagnosis not present

## 2017-02-15 DIAGNOSIS — R109 Unspecified abdominal pain: Secondary | ICD-10-CM | POA: Diagnosis not present

## 2017-02-15 DIAGNOSIS — O23591 Infection of other part of genital tract in pregnancy, first trimester: Secondary | ICD-10-CM | POA: Diagnosis not present

## 2017-02-15 LAB — URINALYSIS, ROUTINE W REFLEX MICROSCOPIC
Bilirubin Urine: NEGATIVE
Glucose, UA: NEGATIVE mg/dL
Ketones, ur: NEGATIVE mg/dL
NITRITE: NEGATIVE
PH: 5 (ref 5.0–8.0)
Protein, ur: NEGATIVE mg/dL
SPECIFIC GRAVITY, URINE: 1.025 (ref 1.005–1.030)

## 2017-02-15 LAB — WET PREP, GENITAL
SPERM: NONE SEEN
Trich, Wet Prep: NONE SEEN
Yeast Wet Prep HPF POC: NONE SEEN

## 2017-02-15 LAB — CBC
HCT: 41.8 % (ref 36.0–46.0)
Hemoglobin: 14.1 g/dL (ref 12.0–15.0)
MCH: 31.1 pg (ref 26.0–34.0)
MCHC: 33.7 g/dL (ref 30.0–36.0)
MCV: 92.1 fL (ref 78.0–100.0)
Platelets: 274 10*3/uL (ref 150–400)
RBC: 4.54 MIL/uL (ref 3.87–5.11)
RDW: 14 % (ref 11.5–15.5)
WBC: 5.8 10*3/uL (ref 4.0–10.5)

## 2017-02-15 LAB — POCT PREGNANCY, URINE: Preg Test, Ur: POSITIVE — AB

## 2017-02-15 LAB — HCG, QUANTITATIVE, PREGNANCY: hCG, Beta Chain, Quant, S: 4749 m[IU]/mL — ABNORMAL HIGH (ref <5)

## 2017-02-15 MED ORDER — PRENATAL VITAMIN 27-0.8 MG PO TABS: 1.0000 | ORAL_TABLET | Freq: Every day | ORAL | 2 refills | Status: DC

## 2017-02-15 MED ORDER — METRONIDAZOLE 500 MG PO TABS: 500.0000 mg | ORAL_TABLET | Freq: Two times a day (BID) | ORAL | 0 refills | Status: AC

## 2017-02-15 NOTE — MAU Note (Signed)
Pt stated she has been spotting since yesterday. Pt had positive HPT. Pt reports mild cramping and nausea.

## 2017-02-15 NOTE — Discharge Instructions (Signed)
Vaginal Bleeding During Pregnancy, First Trimester °A small amount of bleeding (spotting) from the vagina is common in early pregnancy. Sometimes the bleeding is normal and is not a problem, and sometimes it is a sign of something serious. Be sure to tell your doctor about any bleeding from your vagina right away. °Follow these instructions at home: °· Watch your condition for any changes. °· Follow your doctor's instructions about how active you can be. °· If you are on bed rest: °? You may need to stay in bed and only get up to use the bathroom. °? You may be allowed to do some activities. °? If you need help, make plans for someone to help you. °· Write down: °? The number of pads you use each day. °? How often you change pads. °? How soaked (saturated) your pads are. °· Do not use tampons. °· Do not douche. °· Do not have sex or orgasms until your doctor says it is okay. °· If you pass any tissue from your vagina, save the tissue so you can show it to your doctor. °· Only take medicines as told by your doctor. °· Do not take aspirin because it can make you bleed. °· Keep all follow-up visits as told by your doctor. °Contact a doctor if: °· You bleed from your vagina. °· You have cramps. °· You have labor pains. °· You have a fever that does not go away after you take medicine. °Get help right away if: °· You have very bad cramps in your back or belly (abdomen). °· You pass large clots or tissue from your vagina. °· You bleed more. °· You feel light-headed or weak. °· You pass out (faint). °· You have chills. °· You are leaking fluid or have a gush of fluid from your vagina. °· You pass out while pooping (having a bowel movement). °This information is not intended to replace advice given to you by your health care provider. Make sure you discuss any questions you have with your health care provider. °Document Released: 06/15/2013 Document Revised: 07/07/2015 Document Reviewed: 10/06/2012 °[employer] Interactive  Patient Education © 2018 [employer] ° °

## 2017-02-15 NOTE — MAU Provider Note (Signed)
History  CSN: [id number] Arrival date and time: 02/15/17 1503  First Provider Initiated Contact with Patient 02/15/17 1752      Chief Complaint  Patient presents with  . Vaginal Bleeding    HPI: Janet Rodriguez is a 27 y.o. with positive HPT and LMP of 12/23/2016, who presents to maternity admissions reporting vaginal bleeding and lower abdominal cramping. She reports she started having spotting yesterday, and this persisted today. Blood soaked a panty liner today, but not more than that. Has some mild lower abdominal cramping. Denies other symptoms, including no abnornal vaginal discharge, vagina odor, dysuria, hematuria, urinary frequency, HA, dizziness, N/V, or fever/chills.    Past obstetric history: OB History  Gravida Para Term Preterm AB Living  3 2 2     2   SAB TAB Ectopic Multiple Live Births          1    # Outcome Date GA Lbr Len/2nd Weight Sex Delivery Anes PTL Lv  3 Current           2 Term 07/09/12 [redacted]w[redacted]d 379:25 / 00:31 6 lb 4.5 oz (2.849 kg) F Vag-Spont EPI  LIV     Birth Comments: WNL  1 Term 2012              Past Medical History:  Diagnosis Date  . Headache(784.0)   . Herpes   . Infection    Past Surgical History:  Procedure Laterality Date  . NO PAST SURGERIES     Social History   Socioeconomic History  . Marital status: Single    Spouse name: Not on file  . Number of children: Not on file  . Years of education: Not on file  . Highest education level: Not on file  Social Needs  . Financial resource strain: Not on file  . Food insecurity - worry: Not on file  . Food insecurity - inability: Not on file  . Transportation needs - medical: Not on file  . Transportation needs - non-medical: Not on file  Occupational History  . Not on file  Tobacco Use  . Smoking status: Former Smoker    Packs/day: 0.25    Years: 10.00    Pack years: 2.50    Types: Cigarettes    Last attempt to quit: 10/28/2011    Years since quitting: 5.3  . Smokeless tobacco:  Former Neurosurgeon    Quit date: 03/11/2012  Substance and Sexual Activity  . Alcohol use: No  . Drug use: No  . Sexual activity: Yes    Birth control/protection: None  Other Topics Concern  . Not on file  Social History Narrative  . Not on file   No Known Allergies  Medications Prior to Admission  Medication Sig Dispense Refill Last Dose  . PRESCRIPTION MEDICATION Take 1 tablet by mouth daily. Pt states that she take a valcyclovir tablet, but did not know the strength. I called the pharmacy listed but they had no record of it.   02/15/2017 at Unknown time    I have reviewed patient's Past Medical Hx, Surgical Hx, Family Hx, Social Hx, medications and allergies.   Review of Systems - Negative except for what is mentioned in HPI.  Physical Exam   Blood pressure 123/68, pulse 78, temperature 98.3 F (36.8 C), resp. rate 18, last menstrual period 12/23/2016.  Constitutional: Well-developed, well-nourished female in no acute distress.  HENT: [place]/AT, MMM Eyes: normal conjunctivae, no scleral icterus Cardiovascular: normal rate Respiratory: normal effort GI: Abd soft,  non-tender, non-distended  Pelvic: NEFG. Normal vaginal mucosa without lesions; cervix pink, visually closed, without lesion; small amount of browish blood in vaginal vault w/o active bleeding. Cervix closed/long, firm, w/o tenderness. MSK: Extremities nontender, no edema Neurologic: Alert and oriented x 4. Psych: Normal mood and affect Skin: warm and dry   MAU Course/MDM:   Nursing notes and VS reviewed. Patient seen and examined, as noted above.   Cultures collected to r/o pelvic infection Labs ordered: Quant HCG, CBC She is Rh postive Ultrasound to r/o ectopic.   Results reviewed:  Results for orders placed or performed during the hospital encounter of 02/15/17  Urinalysis, Routine w reflex microscopic  Result Value Ref Range   Color, Urine YELLOW YELLOW   APPearance HAZY (A) CLEAR   Specific Gravity, Urine  1.025 1.005 - 1.030   pH 5.0 5.0 - 8.0   Glucose, UA NEGATIVE NEGATIVE mg/dL   Hgb urine dipstick LARGE (A) NEGATIVE   Bilirubin Urine NEGATIVE NEGATIVE   Ketones, ur NEGATIVE NEGATIVE mg/dL   Protein, ur NEGATIVE NEGATIVE mg/dL   Nitrite NEGATIVE NEGATIVE   Leukocytes, UA SMALL (A) NEGATIVE   RBC / HPF 0-5 0 - 5 RBC/hpf   WBC, UA 0-5 0 - 5 WBC/hpf   Bacteria, UA RARE (A) NONE SEEN   Squamous Epithelial / LPF 0-5 (A) NONE SEEN   Mucus PRESENT   CBC  Result Value Ref Range   WBC 5.8 4.0 - 10.5 K/uL   RBC 4.54 3.87 - 5.11 MIL/uL   Hemoglobin 14.1 12.0 - 15.0 g/dL   HCT 16.141.8 09.636.0 - 04.546.0 %   MCV 92.1 78.0 - 100.0 fL   MCH 31.1 26.0 - 34.0 pg   MCHC 33.7 30.0 - 36.0 g/dL   RDW 40.914.0 81.111.5 - 91.415.5 %   Platelets 274 150 - 400 K/uL  hCG, quantitative, pregnancy  Result Value Ref Range   hCG, Beta Chain, Quant, S 4,749 (H) <5 mIU/mL  Pregnancy, urine POC  Result Value Ref Range   Preg Test, Ur POSITIVE (A) NEGATIVE    US OB Transvaginal CLINICAL DATA:  Vaginal bleeding in first trimester of pregnancy  EXAM: OBSTETRIC <14 WK US AND TRANSVAGINAL OB US  TECHNIQUE: Both transabdominal and transvaginal ultrasound examinations were performed for complete evaluation of the gestation as well as the maternal uterus, adnexal regions, and pelvic cul-de-sac. Transvaginal technique was performed to assess early pregnancy.  COMPARISON:  None for this gestation  FINDINGS: Intrauterine gestational sac: Present Yolk sac:  Present Embryo:  Present Cardiac Activity: Present Heart Rate: 95  bpm CRL:  3.6  mm   6 w   0 d                  US EDC: 10/11/2017  Subchorionic hemorrhage:  None visualized. Maternal uterus/adnexae: RIGHT ovary measures 2.9 x 3.1 x 2.4 cm and contains a corpus luteum. LEFT ovary normal size and morphology, 1.2 x 2.7 x 1.2 cm. No adnexal masses or free pelvic fluid.  IMPRESSION: Single live intrauterine gestation at 6 weeks 0 days EGA as above. No acute  abnormalities.  Electronically Signed   By: Ulyses SouthwardMark  Boles M.D.   On: 02/15/2017 17:42  --Discussed results with patient, U/S showing IUP. Discussed ddx of first trimester bleeding and return precautions --Discussed wet prep results and likely BV, will treat with flagyl   Assessment and Plan  Assessment: 1. Vaginal bleeding in pregnancy, first trimester   2. Abdominal pain during pregnancy in first  trimester   3. Normal intrauterine pregnancy on prenatal ultrasound in first trimester   4. BV (bacterial vaginosis)     Plan: --Discharge home in stable condition.  --Rx: flagyl 500 mg BID x 7 day --Rx: PNV daily --She reports she has appt at [facility] to [facility]. --Encouraged to return here or to other Urgent Care/ED if she develops worsening of symptoms, increase in pain, fever, or other concerning symptoms.    Meela Wareing, Kandra Nicolas, MD 02/15/2017 6:47 PM

## 2017-02-18 ENCOUNTER — Encounter (HOSPITAL_COMMUNITY): Payer: Self-pay

## 2017-02-18 ENCOUNTER — Inpatient Hospital Stay (HOSPITAL_COMMUNITY): Payer: Medicaid Other

## 2017-02-18 ENCOUNTER — Inpatient Hospital Stay (HOSPITAL_COMMUNITY)
Admission: AD | Admit: 2017-02-18 | Discharge: 2017-02-18 | Disposition: A | Discharge status: Home / Self Care | Payer: Medicaid Other | Source: Ambulatory Visit | Attending: Obstetrics and Gynecology | Admitting: Obstetrics and Gynecology

## 2017-02-18 DIAGNOSIS — R10816 Epigastric abdominal tenderness: Secondary | ICD-10-CM | POA: Insufficient documentation

## 2017-02-18 DIAGNOSIS — Z87891 Personal history of nicotine dependence: Secondary | ICD-10-CM | POA: Diagnosis not present

## 2017-02-18 DIAGNOSIS — O26891 Other specified pregnancy related conditions, first trimester: Secondary | ICD-10-CM | POA: Diagnosis not present

## 2017-02-18 DIAGNOSIS — O200 Threatened abortion: Secondary | ICD-10-CM | POA: Diagnosis not present

## 2017-02-18 DIAGNOSIS — O209 Hemorrhage in early pregnancy, unspecified: Secondary | ICD-10-CM | POA: Diagnosis not present

## 2017-02-18 DIAGNOSIS — Z3A01 Less than 8 weeks gestation of pregnancy: Secondary | ICD-10-CM | POA: Insufficient documentation

## 2017-02-18 DIAGNOSIS — N898 Other specified noninflammatory disorders of vagina: Secondary | ICD-10-CM | POA: Diagnosis present

## 2017-02-18 HISTORY — DX: Acute vaginitis: N76.0

## 2017-02-18 HISTORY — DX: Other specified bacterial agents as the cause of diseases classified elsewhere: B96.89

## 2017-02-18 HISTORY — DX: Personal history of (healed) traumatic fracture: Z87.81

## 2017-02-18 LAB — URINALYSIS, ROUTINE W REFLEX MICROSCOPIC
BILIRUBIN URINE: NEGATIVE
Glucose, UA: NEGATIVE mg/dL
KETONES UR: NEGATIVE mg/dL
Nitrite: NEGATIVE
Protein, ur: 30 mg/dL — AB
Specific Gravity, Urine: 1.018 (ref 1.005–1.030)
pH: 7 (ref 5.0–8.0)

## 2017-02-18 LAB — CBC
HCT: 40.7 % (ref 36.0–46.0)
Hemoglobin: 13.8 g/dL (ref 12.0–15.0)
MCH: 31.2 pg (ref 26.0–34.0)
MCHC: 33.9 g/dL (ref 30.0–36.0)
MCV: 92.1 fL (ref 78.0–100.0)
PLATELETS: 263 10*3/uL (ref 150–400)
RBC: 4.42 MIL/uL (ref 3.87–5.11)
RDW: 13.8 % (ref 11.5–15.5)
WBC: 7.9 10*3/uL (ref 4.0–10.5)

## 2017-02-18 LAB — GC/CHLAMYDIA PROBE AMP (~~LOC~~) NOT AT ARMC
Chlamydia: NEGATIVE
Neisseria Gonorrhea: NEGATIVE

## 2017-02-18 MED ORDER — ACETAMINOPHEN 500 MG PO TABS
1000.0000 mg | ORAL_TABLET | Freq: Once | ORAL | Status: AC
  Administered 2017-02-18: 1000 mg via ORAL
  Filled 2017-02-18: qty 2

## 2017-02-18 NOTE — MAU Note (Signed)
Pt unable to give urine sample right now  

## 2017-02-18 NOTE — MAU Note (Signed)
Pt was having spotting the other day, has increased and is seeing some tissue. Having some pain on her left side.

## 2017-02-18 NOTE — MAU Provider Note (Signed)
Chief Complaint: Vaginal Bleeding and Abdominal Pain   First Provider Initiated Contact with Patient 02/18/17 1219        SUBJECTIVE HPI: Janet Rodriguez is a 27 y.o. G3P2002 at 4827w3d by LMP who presents to maternity admissions reporting vaginal spotting of 4 days duration and new-onset left-sided abdominal pain, lightheadedness, and exercise intolerance. She denies vaginal itching/burning, urinary symptoms, n/v, or fever/chills.    She reports vaginal spotting that began on approximately 02/14/17.  She presented to the MAU 02/15/17 with spotting and lower abdominal cramping.  Lab tests revealed positive hCG, UA with small leukocyte & rare bacteria, and wet prep with clue cells & WBCs.  She was started on metronidazole and scheduled for OB follow-up.  Yesterday (1/6), vaginal bleeding worsened to where she would soak through panty liners.  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  She came in today because she is concerned about her pregnancy.  Past Medical History:  Diagnosis Date  . BV (bacterial vaginosis)   . Headache(784.0)   . Herpes   . History of pelvic fracture   . Infection    Past Surgical History:  Procedure Laterality Date  . ROOT CANAL     Social History   Socioeconomic History  . Marital status: Single    Spouse name: Not on file  . Number of children: Not on file  . Years of education: Not on file  . Highest education level: Not on file  Social Needs  . Financial resource strain: Not on file  . Food insecurity - worry: Not on file  . Food insecurity - inability: Not on file  . Transportation needs - medical: Not on file  . Transportation needs - non-medical: Not on file  Occupational History  . Not on file  Tobacco Use  . Smoking status: Former Smoker    Packs/day: 0.25    Years: 10.00    Pack years: 2.50    Types: Cigarettes    Last attempt to quit: 10/28/2011    Years since quitting: 5.3  . Smokeless  tobacco: Former NeurosurgeonUser    Quit date: 03/11/2012  Substance and Sexual Activity  . Alcohol use: No  . Drug use: No  . Sexual activity: Yes    Birth control/protection: None  Other Topics Concern  . Not on file  Social History Narrative  . Not on file   No current facility-administered medications on file prior to encounter.    Current Outpatient Medications on File Prior to Encounter  Medication Sig Dispense Refill  . metroNIDAZOLE (FLAGYL) 500 MG tablet Take 1 tablet (500 mg total) by mouth 2 (two) times daily for 7 days. 14 tablet 0  . Prenatal Vit-Fe Fumarate-FA (PRENATAL VITAMIN) 27-0.8 MG TABS Take 1 tablet by mouth daily. 90 tablet 2   No Known Allergies  I have reviewed patient's Past Medical Hx, Surgical Hx, Family Hx, Social Hx, medications and allergies.   Review of Systems: Other systems negative   Physical Exam  Physical Exam  Constitutional: She is oriented to person, place, and time and well-developed, well-nourished, and in no distress. No distress.  Cardiovascular: Normal rate and regular rhythm.  Pulmonary/Chest: Effort normal and breath sounds normal.  Abdominal: Soft. Bowel sounds are normal. She exhibits no distension. There is no rebound.  Mild epigastric tenderness.  Musculoskeletal: Normal range of motion. She exhibits no edema or tenderness.  Neurological: She is alert and oriented to person, place,  and time.  Skin: Skin is warm and dry.  Psychiatric: Mood, memory, affect and judgment normal.  PELVIC EXAM: Cervix pink, visually closed, without lesion, scant [place] creamy discharge, vaginal walls and external genitalia normal  Patient Vitals for the past 24 hrs:  BP Temp Pulse Resp Height Weight  02/18/17 1153 92/63 - 69 - - -  02/18/17 1152 - 98.7 F (37.1 C) - 18 5' 5.5" (1.664 m) 50.3 kg (111 lb)    LAB RESULTS Results for orders placed or performed during the hospital encounter of 02/18/17 (from the past 24 hour(s))  Urinalysis, Routine w reflex  microscopic     Status: Abnormal   Collection Time: 02/18/17 11:50 AM  Result Value Ref Range   Color, Urine YELLOW YELLOW   APPearance CLOUDY (A) CLEAR   Specific Gravity, Urine 1.018 1.005 - 1.030   pH 7.0 5.0 - 8.0   Glucose, UA NEGATIVE NEGATIVE mg/dL   Hgb urine dipstick LARGE (A) NEGATIVE   Bilirubin Urine NEGATIVE NEGATIVE   Ketones, ur NEGATIVE NEGATIVE mg/dL   Protein, ur 30 (A) NEGATIVE mg/dL   Nitrite NEGATIVE NEGATIVE   Leukocytes, UA MODERATE (A) NEGATIVE   RBC / HPF 6-30 0 - 5 RBC/hpf   WBC, UA TOO NUMEROUS TO COUNT 0 - 5 WBC/hpf   Bacteria, UA RARE (A) NONE SEEN   Squamous Epithelial / LPF 6-30 (A) NONE SEEN   WBC Clumps PRESENT    Mucus PRESENT   CBC     Status: None   Collection Time: 02/18/17 12:43 PM  Result Value Ref Range   WBC 7.9 4.0 - 10.5 K/uL   RBC 4.42 3.87 - 5.11 MIL/uL   Hemoglobin 13.8 12.0 - 15.0 g/dL   HCT 16.1 09.6 - 04.5 %   MCV 92.1 78.0 - 100.0 fL   MCH 31.2 26.0 - 34.0 pg   MCHC 33.9 30.0 - 36.0 g/dL   RDW 40.9 81.1 - 91.4 %   Platelets 263 150 - 400 K/uL       IMAGING US Ob Comp Less 14 Wks  Result Date: 02/15/2017 CLINICAL DATA:  Vaginal bleeding in first trimester of pregnancy EXAM: OBSTETRIC <14 WK [place] AND TRANSVAGINAL OB US TECHNIQUE: Both transabdominal and transvaginal ultrasound examinations were performed for complete evaluation of the gestation as well as the maternal uterus, adnexal regions, and pelvic cul-de-sac. Transvaginal technique was performed to assess early pregnancy. COMPARISON:  None for this gestation FINDINGS: Intrauterine gestational sac: Present Yolk sac:  Present Embryo:  Present Cardiac Activity: Present Heart Rate: 95  bpm CRL:  3.6  mm   6 w   0 d                  [place] EDC: 10/11/2017 Subchorionic hemorrhage:  None visualized. Maternal uterus/adnexae: RIGHT ovary measures 2.9 x 3.1 x 2.4 cm and contains a corpus luteum. LEFT ovary normal size and morphology, 1.2 x 2.7 x 1.2 cm. No adnexal masses or free pelvic  fluid. IMPRESSION: Single live intrauterine gestation at 6 weeks 0 days EGA as above. No acute abnormalities. Electronically Signed   By: Ulyses Southward M.D.   On: 02/15/2017 17:42   US Ob Transvaginal  Result Date: 02/18/2017 CLINICAL DATA:  Assigned gestational age of [redacted] weeks 3 days by prior ultrasound. Left-sided pelvic pain and vaginal bleeding. EXAM: TRANSVAGINAL OB ULTRASOUND TECHNIQUE: Transvaginal ultrasound was performed for complete evaluation of the gestation as well as the maternal uterus, adnexal regions, and pelvic cul-de-sac. COMPARISON:  02/15/2017 FINDINGS: Intrauterine gestational sac: Single Yolk sac:  Visualized. Embryo:  Visualized. Cardiac Activity: Not Visualized. CRL:   3  mm   5 w 6 d                  [place] EDC: 10/15/2017 Subchorionic hemorrhage:  None visualized. Maternal uterus/adnexae: None IMPRESSION: Findings are suspicious but not yet definitive for failed pregnancy. Recommend follow-up US in 10 days for definitive diagnosis. This recommendation follows SRU consensus guidelines: Diagnostic Criteria for Nonviable Pregnancy Early in the First Trimester. Malva Limes Med 2013; 161:0960-45. Electronically Signed   By: Myles Rosenthal M.D.   On: 02/18/2017 13:54   US Ob Transvaginal  Result Date: 02/15/2017 CLINICAL DATA:  Vaginal bleeding in first trimester of pregnancy EXAM: OBSTETRIC <14 WK [place] AND TRANSVAGINAL OB US TECHNIQUE: Both transabdominal and transvaginal ultrasound examinations were performed for complete evaluation of the gestation as well as the maternal uterus, adnexal regions, and pelvic cul-de-sac. Transvaginal technique was performed to assess early pregnancy. COMPARISON:  None for this gestation FINDINGS: Intrauterine gestational sac: Present Yolk sac:  Present Embryo:  Present Cardiac Activity: Present Heart Rate: 95  bpm CRL:  3.6  mm   6 w   0 d                  [place] EDC: 10/11/2017 Subchorionic hemorrhage:  None visualized. Maternal uterus/adnexae: RIGHT ovary measures 2.9 x  3.1 x 2.4 cm and contains a corpus luteum. LEFT ovary normal size and morphology, 1.2 x 2.7 x 1.2 cm. No adnexal masses or free pelvic fluid. IMPRESSION: Single live intrauterine gestation at 6 weeks 0 days EGA as above. No acute abnormalities. Electronically Signed   By: Ulyses Southward M.D.   On: 02/15/2017 17:42    MAU Management/MDM: UA collected today, GC/Chlam probe collected 1/4.  CBC and [place] ordered to rule out spontaneous abortion. CBC was normal.  U/S (above) showed findings suspicious but not yet definitive for failed pregnancy. Recommend follow-up US in 10 days for definitive diagnosis.  This bleeding/pain can represent a normal pregnancy with bleeding, spontaneous abortion or even an ectopic which can be life-threatening.  The process as listed above helps to determine which of these is present.  ASSESSMENT 1. Vaginal bleeding in pregnancy, first trimester     PLAN Discharge home, schedule [place] in 10 days with suspicion for failed pregnancy.  Pt stable at time of discharge. Encouraged to return here or to other Urgent Care/ED if she develops worsening of symptoms, increase in pain, fever, or other concerning symptoms.

## 2017-02-18 NOTE — Discharge Instructions (Signed)

## 2017-02-20 ENCOUNTER — Encounter (HOSPITAL_COMMUNITY): Payer: Self-pay

## 2017-02-20 ENCOUNTER — Inpatient Hospital Stay (HOSPITAL_COMMUNITY)
Admission: AD | Admit: 2017-02-20 | Discharge: 2017-02-20 | Disposition: A | Discharge status: Home / Self Care | Payer: Medicaid Other | Source: Ambulatory Visit | Attending: Obstetrics and Gynecology | Admitting: Obstetrics and Gynecology

## 2017-02-20 ENCOUNTER — Other Ambulatory Visit: Payer: Self-pay

## 2017-02-20 DIAGNOSIS — Z87891 Personal history of nicotine dependence: Secondary | ICD-10-CM | POA: Diagnosis not present

## 2017-02-20 DIAGNOSIS — Z3A01 Less than 8 weeks gestation of pregnancy: Secondary | ICD-10-CM | POA: Diagnosis not present

## 2017-02-20 DIAGNOSIS — N898 Other specified noninflammatory disorders of vagina: Secondary | ICD-10-CM | POA: Diagnosis present

## 2017-02-20 DIAGNOSIS — O039 Complete or unspecified spontaneous abortion without complication: Secondary | ICD-10-CM | POA: Diagnosis not present

## 2017-02-20 LAB — HCG, QUANTITATIVE, PREGNANCY: HCG, BETA CHAIN, QUANT, S: 2609 m[IU]/mL — AB (ref <5)

## 2017-02-20 LAB — URINALYSIS, ROUTINE W REFLEX MICROSCOPIC
Bacteria, UA: NONE SEEN
Bilirubin Urine: NEGATIVE
Glucose, UA: NEGATIVE mg/dL
Ketones, ur: NEGATIVE mg/dL
NITRITE: NEGATIVE
PH: 7 (ref 5.0–8.0)
Protein, ur: NEGATIVE mg/dL
Specific Gravity, Urine: 1.008 (ref 1.005–1.030)

## 2017-02-20 NOTE — MAU Note (Signed)
Urine in lab 

## 2017-02-20 NOTE — Discharge Instructions (Signed)

## 2017-02-20 NOTE — MAU Note (Signed)
Passed a golf ball sized clot, had only used 1 pad today, then passed the clot when she used the restroom.  No real pain, just some cramping.

## 2017-02-20 NOTE — MAU Provider Note (Signed)
Chief Complaint: Vaginal Bleeding  First Provider Initiated Contact with Patient 02/20/17 1650        SUBJECTIVE HPI: Janet PolQuadicia C Rodriguez is a 27 y.o. G3P2002 at 4225w5d by LMP who presents to maternity admissions reporting vaginal bleeding and passage of clots. She had previously presented to the MAU on 1/4 and 1/7 with vaginal bleeding, as well. On 1/4, US showed FHR of 95 with EGA 6345w0d.  On 1/7, US was not able to visualize cardiac activity and EGA of 6665w6d.  This is consistent with a failed pregnancy.  bHCG confirmed diagnosis. We discussed with the patient the complete clinical picture, and she expressed understanding and appropriate grief.  She agreed to a follow-up appointment in one week.   She denies vaginal itching/burning, urinary symptoms, h/a, dizziness, n/v, or fever/chills.     Past Medical History:  Diagnosis Date  . BV (bacterial vaginosis)   . Headache(784.0)   . Herpes   . History of pelvic fracture   . Infection    Past Surgical History:  Procedure Laterality Date  . ROOT CANAL     Social History   Socioeconomic History  . Marital status: Single    Spouse name: Not on file  . Number of children: Not on file  . Years of education: Not on file  . Highest education level: Not on file  Social Needs  . Financial resource strain: Not on file  . Food insecurity - worry: Not on file  . Food insecurity - inability: Not on file  . Transportation needs - medical: Not on file  . Transportation needs - non-medical: Not on file  Occupational History  . Not on file  Tobacco Use  . Smoking status: Former Smoker    Packs/day: 0.25    Years: 10.00    Pack years: 2.50    Types: Cigarettes    Last attempt to quit: 10/28/2011    Years since quitting: 5.3  . Smokeless tobacco: Former NeurosurgeonUser    Quit date: 03/11/2012  Substance and Sexual Activity  . Alcohol use: No  . Drug use: No  . Sexual activity: Not Currently    Birth control/protection: None  Other Topics Concern  . Not  on file  Social History Narrative  . Not on file   No current facility-administered medications on file prior to encounter.    Current Outpatient Medications on File Prior to Encounter  Medication Sig Dispense Refill  . metroNIDAZOLE (FLAGYL) 500 MG tablet Take 1 tablet (500 mg total) by mouth 2 (two) times daily for 7 days. 14 tablet 0  . Prenatal Vit-Fe Fumarate-FA (PRENATAL VITAMIN) 27-0.8 MG TABS Take 1 tablet by mouth daily. 90 tablet 2   No Known Allergies  I have reviewed patient's Past Medical Hx, Surgical Hx, Family Hx, Social Hx, medications and allergies.   ROS: Other systems negative   Physical Exam   Patient Vitals for the past 24 hrs:  BP Temp Temp src Pulse Resp Weight  02/20/17 1817 128/74 - - - - -  02/20/17 1643 119/66 98.2 F (36.8 C) Oral 77 18 -  02/20/17 1629 - - - - - 50.3 kg (111 lb)   Constitutional: Well-developed, well-nourished female in no acute distress.  Cardiovascular: normal rate Respiratory: normal effort GI: Abd soft, non-tender. Pos BS x 4 MS: Extremities nontender, no edema, normal ROM Neurologic: Alert and oriented x 4.  GU: Neg CVAT. Psych: Tearful, upset  PELVIC EXAM: Deferred  LAB RESULTS Results for orders placed  or performed during the hospital encounter of 02/20/17 (from the past 24 hour(s))  Urinalysis, Routine w reflex microscopic     Status: Abnormal   Collection Time: 02/20/17  4:25 PM  Result Value Ref Range   Color, Urine YELLOW YELLOW   APPearance CLEAR CLEAR   Specific Gravity, Urine 1.008 1.005 - 1.030   pH 7.0 5.0 - 8.0   Glucose, UA NEGATIVE NEGATIVE mg/dL   Hgb urine dipstick SMALL (A) NEGATIVE   Bilirubin Urine NEGATIVE NEGATIVE   Ketones, ur NEGATIVE NEGATIVE mg/dL   Protein, ur NEGATIVE NEGATIVE mg/dL   Nitrite NEGATIVE NEGATIVE   Leukocytes, UA TRACE (A) NEGATIVE   RBC / HPF 0-5 0 - 5 RBC/hpf   WBC, UA 0-5 0 - 5 WBC/hpf   Bacteria, UA NONE SEEN NONE SEEN   Squamous Epithelial / LPF 0-5 (A) NONE SEEN   hCG, quantitative, pregnancy     Status: Abnormal   Collection Time: 02/20/17  4:59 PM  Result Value Ref Range   hCG, Beta Chain, Quant, S 2,609 (H) <5 mIU/mL   IMAGING US Ob Comp Less 14 Wks  Result Date: 02/15/2017 CLINICAL DATA:  Vaginal bleeding in first trimester of pregnancy EXAM: OBSTETRIC <14 WK [place] AND TRANSVAGINAL OB US TECHNIQUE: Both transabdominal and transvaginal ultrasound examinations were performed for complete evaluation of the gestation as well as the maternal uterus, adnexal regions, and pelvic cul-de-sac. Transvaginal technique was performed to assess early pregnancy. COMPARISON:  None for this gestation FINDINGS: Intrauterine gestational sac: Present Yolk sac:  Present Embryo:  Present Cardiac Activity: Present Heart Rate: 95  bpm CRL:  3.6  mm   6 w   0 d                  [place] EDC: 10/11/2017 Subchorionic hemorrhage:  None visualized. Maternal uterus/adnexae: RIGHT ovary measures 2.9 x 3.1 x 2.4 cm and contains a corpus luteum. LEFT ovary normal size and morphology, 1.2 x 2.7 x 1.2 cm. No adnexal masses or free pelvic fluid. IMPRESSION: Single live intrauterine gestation at 6 weeks 0 days EGA as above. No acute abnormalities. Electronically Signed   By: Ulyses Southward M.D.   On: 02/15/2017 17:42   US Ob Transvaginal  Result Date: 02/18/2017 CLINICAL DATA:  Assigned gestational age of [redacted] weeks 3 days by prior ultrasound. Left-sided pelvic pain and vaginal bleeding. EXAM: TRANSVAGINAL OB ULTRASOUND TECHNIQUE: Transvaginal ultrasound was performed for complete evaluation of the gestation as well as the maternal uterus, adnexal regions, and pelvic cul-de-sac. COMPARISON:  02/15/2017 FINDINGS: Intrauterine gestational sac: Single Yolk sac:  Visualized. Embryo:  Visualized. Cardiac Activity: Not Visualized. CRL:   3  mm   5 w 6 d                  [place] EDC: 10/15/2017 Subchorionic hemorrhage:  None visualized. Maternal uterus/adnexae: None IMPRESSION: Findings are suspicious but not yet definitive  for failed pregnancy. Recommend follow-up US in 10 days for definitive diagnosis. This recommendation follows SRU consensus guidelines: Diagnostic Criteria for Nonviable Pregnancy Early in the First Trimester. Malva Limes Med 2013; 161:0960-45. Electronically Signed   By: Myles Rosenthal M.D.   On: 02/18/2017 13:54   US Ob Transvaginal  Result Date: 02/15/2017 CLINICAL DATA:  Vaginal bleeding in first trimester of pregnancy EXAM: OBSTETRIC <14 WK [place] AND TRANSVAGINAL OB US TECHNIQUE: Both transabdominal and transvaginal ultrasound examinations were performed for complete evaluation of the gestation as well as the maternal uterus, adnexal  regions, and pelvic cul-de-sac. Transvaginal technique was performed to assess early pregnancy. COMPARISON:  None for this gestation FINDINGS: Intrauterine gestational sac: Present Yolk sac:  Present Embryo:  Present Cardiac Activity: Present Heart Rate: 95  bpm CRL:  3.6  mm   6 w   0 d                  [place] EDC: 10/11/2017 Subchorionic hemorrhage:  None visualized. Maternal uterus/adnexae: RIGHT ovary measures 2.9 x 3.1 x 2.4 cm and contains a corpus luteum. LEFT ovary normal size and morphology, 1.2 x 2.7 x 1.2 cm. No adnexal masses or free pelvic fluid. IMPRESSION: Single live intrauterine gestation at 6 weeks 0 days EGA as above. No acute abnormalities. Electronically Signed   By: Ulyses Southward M.D.   On: 02/15/2017 17:42    ASSESSMENT 1. SAB (spontaneous abortion)    She had previously presented to the MAU on 1/4 and 1/7 with vaginal bleeding, as well. On 1/4, US showed FHR of 95 with EGA [redacted]w[redacted]d.  On 1/7, [place] was not able to visualize cardiac activity and EGA of [redacted]w[redacted]d.  This is consistent with a failed pregnancy. Precipitous decrease in bHCG today confirmed diagnosis.   PLAN Discharge home Scheduled follow-up appointment in one week for recheck.  Follow-up Information    [facility].   Specialty:  Obstetrics and Gynecology Why:  The office  will call you to schedule a follow up  Contact information: [address] [place] [address] [phone]         Pt stable at time of discharge. Patient declining exam, states she would just like to leave.  Encouraged to return here or to other Urgent Care/ED if she develops worsening of symptoms, increase in pain, fever, or other concerning symptoms.   Larose Hires, MS3 02/20/2017  6:55 PM    I confirm that I have verified the information documented in the Medical students's note and that I have also personally reperformed the physical exam and all medical decision making activities.  Message sent to cancel upcoming [place].   Duane Lope, NP 02/20/2017 8:26 PM

## 2017-02-21 ENCOUNTER — Telehealth: Payer: Self-pay

## 2017-02-21 NOTE — Telephone Encounter (Signed)
Returned call and pt stated that she had a SAB yesterday and needs to cancel NOB appt and schedule a follow up next week, advised that I would have scheduler call her back.

## 2017-02-25 ENCOUNTER — Ambulatory Visit: Payer: Medicaid Other | Admitting: Obstetrics

## 2017-02-26 ENCOUNTER — Encounter: Payer: Self-pay | Admitting: Obstetrics

## 2017-02-26 ENCOUNTER — Ambulatory Visit (INDEPENDENT_AMBULATORY_CARE_PROVIDER_SITE_OTHER): Payer: Medicaid Other | Admitting: Obstetrics

## 2017-02-26 VITALS — BP 108/69 | HR 77 | Wt 114.6 lb

## 2017-02-26 DIAGNOSIS — Z3169 Encounter for other general counseling and advice on procreation: Secondary | ICD-10-CM

## 2017-02-26 DIAGNOSIS — O039 Complete or unspecified spontaneous abortion without complication: Secondary | ICD-10-CM | POA: Diagnosis not present

## 2017-02-26 NOTE — Progress Notes (Signed)
Patient ID: Janet Rodriguez, female   DOB: 10/24/1990, 27 y.o.   MRN: [id number]  Chief Complaint  Patient presents with  . Follow-up    HPI Janet PolQuadicia C Los is a 27 y.o. female.  S/P Spontaneous Abortion, complete.  Presents for follow up.  Denies cramping or abnormal vaginal bleeding. HPI  Past Medical History:  Diagnosis Date  . BV (bacterial vaginosis)   . Headache(784.0)   . Herpes   . History of pelvic fracture   . Infection     Past Surgical History:  Procedure Laterality Date  . ROOT CANAL      Family History  Problem Relation Age of Onset  . Cancer Maternal Grandfather   . Cancer Paternal Grandmother     Social History Social History   Tobacco Use  . Smoking status: Current Every Day Smoker    Packs/day: 0.25    Years: 10.00    Pack years: 2.50    Types: Cigarettes    Last attempt to quit: 10/28/2011    Years since quitting: 5.3  . Smokeless tobacco: Former NeurosurgeonUser    Quit date: 03/11/2012  Substance Use Topics  . Alcohol use: Yes    Comment: occ  . Drug use: No    No Known Allergies  Current Outpatient Medications  Medication Sig Dispense Refill  . acetaminophen (TYLENOL) 325 MG tablet Take 650 mg by mouth every 6 (six) hours as needed.    . Prenatal Vit-Fe Fumarate-FA (PRENATAL VITAMIN) 27-0.8 MG TABS Take 1 tablet by mouth daily. 90 tablet 2   No current facility-administered medications for this visit.     Review of Systems Review of Systems Constitutional: negative for fatigue and weight loss Respiratory: negative for cough and wheezing Cardiovascular: negative for chest pain, fatigue and palpitations Gastrointestinal: negative for abdominal pain and change in bowel habits Genitourinary:negative Integument/breast: negative for nipple discharge Musculoskeletal:negative for myalgias Neurological: negative for gait problems and tremors Behavioral/Psych: negative for abusive relationship, depression Endocrine: negative for temperature  intolerance      Blood pressure 108/69, pulse 77, weight 114 lb 9.6 oz (52 kg), last menstrual period 12/23/2016.  Physical Exam Physical Exam General:   alert  Skin:   no rash or abnormalities  Lungs:   clear to auscultation bilaterally  Heart:   regular rate and rhythm, S1, S2 normal, no murmur, click, rub or gallop  Breasts:   normal without suspicious masses, skin or nipple changes or axillary nodes  Abdomen:  normal findings: no organomegaly, soft, non-tender and no hernia  Pelvis:  External genitalia: normal general appearance Urinary system: urethral meatus normal and bladder without fullness, nontender Vaginal: normal without tenderness, induration or masses Cervix: normal appearance Adnexa: normal bimanual exam Uterus: anteverted and non-tender, normal size    50% of 15 min visit spent on counseling and coordination of care.   Data Reviewed Labs Pathology  Assessment       1. Abortion, spontaneous complete - doing well  2. Encounter for preconception consultation - want to conceive ASAP - encouraged to continue PNV's  Plan    Follow up in 4 weeks   Brock BadHARLES A. HARPER MD

## 2017-03-26 ENCOUNTER — Ambulatory Visit (INDEPENDENT_AMBULATORY_CARE_PROVIDER_SITE_OTHER): Payer: Medicaid Other | Admitting: Obstetrics

## 2017-03-26 ENCOUNTER — Ambulatory Visit: Payer: Medicaid Other | Admitting: Obstetrics

## 2017-03-26 VITALS — BP 111/73 | HR 74 | Wt 112.6 lb

## 2017-03-26 DIAGNOSIS — O039 Complete or unspecified spontaneous abortion without complication: Secondary | ICD-10-CM

## 2017-03-27 ENCOUNTER — Encounter: Payer: Self-pay | Admitting: Obstetrics

## 2017-03-27 NOTE — Progress Notes (Signed)
Patient ID: Janet Rodriguez, female   DOB: 05/22/1990, 27 y.o.   MRN: [id number]  Chief Complaint  Patient presents with  . Follow-up    HPI Janet PolQuadicia C Allum is a 27 y.o. female.  S/P SAB.  Presents for follow up.  No complaints. HPI  Past Medical History:  Diagnosis Date  . BV (bacterial vaginosis)   . Headache(784.0)   . Herpes   . History of pelvic fracture   . Infection     Past Surgical History:  Procedure Laterality Date  . ROOT CANAL      Family History  Problem Relation Age of Onset  . Cancer Maternal Grandfather   . Cancer Paternal Grandmother     Social History Social History   Tobacco Use  . Smoking status: Current Every Day Smoker    Packs/day: 0.25    Years: 10.00    Pack years: 2.50    Types: Cigarettes    Last attempt to quit: 10/28/2011    Years since quitting: 5.4  . Smokeless tobacco: Former NeurosurgeonUser    Quit date: 03/11/2012  Substance Use Topics  . Alcohol use: Yes    Comment: occ  . Drug use: No    No Known Allergies  Current Outpatient Medications  Medication Sig Dispense Refill  . acetaminophen (TYLENOL) 325 MG tablet Take 650 mg by mouth every 6 (six) hours as needed.    . Prenatal Vit-Fe Fumarate-FA (PRENATAL VITAMIN) 27-0.8 MG TABS Take 1 tablet by mouth daily. 90 tablet 2   No current facility-administered medications for this visit.     Review of Systems Review of Systems Constitutional: negative for fatigue and weight loss Respiratory: negative for cough and wheezing Cardiovascular: negative for chest pain, fatigue and palpitations Gastrointestinal: negative for abdominal pain and change in bowel habits Genitourinary:negative Integument/breast: negative for nipple discharge Musculoskeletal:negative for myalgias Neurological: negative for gait problems and tremors Behavioral/Psych: negative for abusive relationship, depression Endocrine: negative for temperature intolerance      Blood pressure 111/73, pulse 74, weight 112 lb  9.6 oz (51.1 kg), last menstrual period 12/23/2016.  Physical Exam Physical Exam General:   alert  Skin:   no rash or abnormalities  Lungs:   clear to auscultation bilaterally  Heart:   regular rate and rhythm, S1, S2 normal, no murmur, click, rub or gallop  Breasts:   normal without suspicious masses, skin or nipple changes or axillary nodes  Abdomen:  normal findings: no organomegaly, soft, non-tender and no hernia  Pelvis:  External genitalia: normal general appearance Urinary system: urethral meatus normal and bladder without fullness, nontender Vaginal: normal without tenderness, induration or masses Cervix: normal appearance Adnexa: normal bimanual exam Uterus: anteverted and non-tender, normal size    50% of 20 min visit spent on counseling and coordination of care.   Data Reviewed Labs  Assessment     1. SAB (spontaneous abortion  - doing well  Plan    Follow up in 3 months for Annual.  Orders Placed This Encounter  Procedures  . Beta HCG, Quant   No orders of the defined types were placed in this encounter.   Brock BadHARLES A. HARPER MD

## 2017-03-29 ENCOUNTER — Encounter: Payer: Self-pay | Admitting: Certified Nurse Midwife

## 2017-12-03 ENCOUNTER — Encounter (HOSPITAL_COMMUNITY): Payer: Self-pay | Admitting: *Deleted

## 2017-12-03 ENCOUNTER — Other Ambulatory Visit: Payer: Self-pay

## 2017-12-03 ENCOUNTER — Ambulatory Visit (HOSPITAL_COMMUNITY)
Admission: EM | Admit: 2017-12-03 | Discharge: 2017-12-03 | Disposition: A | Discharge status: Home / Self Care | Payer: Medicaid Other | Attending: Family Medicine | Admitting: Family Medicine

## 2017-12-03 DIAGNOSIS — M545 Low back pain, unspecified: Secondary | ICD-10-CM

## 2017-12-03 DIAGNOSIS — Z3202 Encounter for pregnancy test, result negative: Secondary | ICD-10-CM

## 2017-12-03 LAB — POCT URINALYSIS DIP (DEVICE)
BILIRUBIN URINE: NEGATIVE
Glucose, UA: NEGATIVE mg/dL
HGB URINE DIPSTICK: NEGATIVE
Ketones, ur: NEGATIVE mg/dL
LEUKOCYTES UA: NEGATIVE
Nitrite: NEGATIVE
PH: 7 (ref 5.0–8.0)
Protein, ur: NEGATIVE mg/dL
SPECIFIC GRAVITY, URINE: 1.02 (ref 1.005–1.030)
Urobilinogen, UA: 1 mg/dL (ref 0.0–1.0)

## 2017-12-03 LAB — POCT PREGNANCY, URINE: Preg Test, Ur: NEGATIVE

## 2017-12-03 MED ORDER — DICLOFENAC SODIUM 75 MG PO TBEC: 75.0000 mg | DELAYED_RELEASE_TABLET | Freq: Two times a day (BID) | ORAL | 0 refills | Status: DC

## 2017-12-03 MED ORDER — KETOROLAC TROMETHAMINE 60 MG/2ML IM SOLN
60.0000 mg | Freq: Once | INTRAMUSCULAR | Status: AC
  Administered 2017-12-03: 60 mg via INTRAMUSCULAR

## 2017-12-03 MED ORDER — KETOROLAC TROMETHAMINE 60 MG/2ML IM SOLN
INTRAMUSCULAR | Status: AC
  Filled 2017-12-03: qty 2

## 2017-12-03 MED ORDER — CYCLOBENZAPRINE HCL 10 MG PO TABS: ORAL_TABLET | ORAL | 0 refills | Status: DC

## 2017-12-03 NOTE — ED Provider Notes (Signed)
[facility]   [id number] 12/03/17 Arrival Time: 1040  ASSESSMENT & PLAN:  1. Acute right-sided low back pain without sciatica   Suspect muscle spasm. Discussed.  Meds ordered this encounter  Medications  . ketorolac (TORADOL) injection 60 mg  . cyclobenzaprine (FLEXERIL) 10 MG tablet    Sig: Take 1 tablet by mouth 3 times daily for muscle spasm. Be aware, this medication may cause excessive drowsiness.    Dispense:  21 tablet    Refill:  0  . diclofenac (VOLTAREN) 75 MG EC tablet    Sig: Take 1 tablet (75 mg total) by mouth 2 (two) times daily.    Dispense:  14 tablet    Refill:  0   Encouraged ROM as she tolerates. See D/C instructions on AVS. Work note given.  Reviewed expectations re: course of current medical issues. Questions answered. Outlined signs and symptoms indicating need for more acute intervention. Patient verbalized understanding. After Visit Summary given.   SUBJECTIVE: History from: patient.  Janet Rodriguez is a 27 y.o. female who presents with complaint of persistent right sided lower back discomfort. Onset abrupt, late last evening. Injury/trama: no. History of back problems: no. Discomfort described as aching with sharp transient exacerbations without radiation. Certain movements exacerbate the described discomfort. Better when still. Extremity sensation changes or weakness: none. Ambulatory without difficulty. Normal bowel/bladder habits: yes. No associated pelvic pain/abdominal pain/n/v. No vaginal discharge. Self treatment: has tried nothing for pain relief. Associated nausea, mild, without emesis.  Reports no fevers, IV drug use, or recent back surgeries or procedures.  Patient's last menstrual period was 12/16/2016 (exact date).  ROS: As per HPI. All other systems negative.    OBJECTIVE:  Vitals:   12/03/17 1056  BP: 114/74  Pulse: 60  Resp: 16  Temp: 98.3 F (36.8 C)  TempSrc: Oral  SpO2: 100%    General appearance:  alert; no distress but appears uncomfortable on exam table Neck: supple with FROM; without midline tenderness CV: RRR without murmer Lungs: unlabored respirations; symmetrical air entry Abdomen: soft, non-tender; non-distended; no inguinal tenderness on R side Back: moderate right sided tenderness of her lower paraspinal musculature; FROM at hips; bruising: none; without midline tenderness Extremities: no edema; symmetrical with no gross deformities; normal ROM of bilateral lower extremities Skin: warm and dry Neurologic: normal gait; normal reflexes of RLE and LLE; normal sensation of RLE and LLE; normal strength of RLE and LLE Psychological: alert and cooperative; normal mood and affect  Labs: Results for orders placed or performed during the hospital encounter of 12/03/17  POCT urinalysis dip (device)  Result Value Ref Range   Glucose, UA NEGATIVE NEGATIVE mg/dL   Bilirubin Urine NEGATIVE NEGATIVE   Ketones, ur NEGATIVE NEGATIVE mg/dL   Specific Gravity, Urine 1.020 1.005 - 1.030   Hgb urine dipstick NEGATIVE NEGATIVE   pH 7.0 5.0 - 8.0   Protein, ur NEGATIVE NEGATIVE mg/dL   Urobilinogen, UA 1.0 0.0 - 1.0 mg/dL   Nitrite NEGATIVE NEGATIVE   Leukocytes, UA NEGATIVE NEGATIVE  Pregnancy, urine POC  Result Value Ref Range   Preg Test, Ur NEGATIVE NEGATIVE    No Known Allergies  Past Medical History:  Diagnosis Date  . BV (bacterial vaginosis)   . Headache(784.0)   . Herpes   . History of pelvic fracture   . Infection    Social History   Socioeconomic History  . Marital status: Single    Spouse name: Not on file  . Number of children:  Not on file  . Years of education: Not on file  . Highest education level: Not on file  Occupational History  . Not on file  Social Needs  . Financial resource strain: Not on file  . Food insecurity:    Worry: Not on file    Inability: Not on file  . Transportation needs:    Medical: Not on file    Non-medical: Not on file    Tobacco Use  . Smoking status: Current Every Day Smoker    Packs/day: 0.25    Years: 10.00    Pack years: 2.50    Types: Cigarettes    Last attempt to quit: 10/28/2011    Years since quitting: 6.1  . Smokeless tobacco: Former Neurosurgeon    Quit date: 03/11/2012  Substance and Sexual Activity  . Alcohol use: Yes    Comment: occ  . Drug use: No  . Sexual activity: Yes    Birth control/protection: None  Lifestyle  . Physical activity:    Days per week: Not on file    Minutes per session: Not on file  . Stress: Not on file  Relationships  . Social connections:    Talks on phone: Not on file    Gets together: Not on file    Attends religious service: Not on file    Active member of club or organization: Not on file    Attends meetings of clubs or organizations: Not on file    Relationship status: Not on file  . Intimate partner violence:    Fear of current or ex partner: Not on file    Emotionally abused: Not on file    Physically abused: Not on file    Forced sexual activity: Not on file  Other Topics Concern  . Not on file  Social History Narrative  . Not on file   Family History  Problem Relation Age of Onset  . Cancer Maternal Grandfather   . Cancer Paternal Grandmother    Past Surgical History:  Procedure Laterality Date  . ROOT CANAL       Mardella Layman, MD 12/03/17 1148

## 2017-12-03 NOTE — Discharge Instructions (Signed)

## 2017-12-03 NOTE — ED Triage Notes (Signed)
C/o back and side hurting states it is making her nauseated. . Denies urinary sx.

## 2019-11-13 ENCOUNTER — Encounter (HOSPITAL_COMMUNITY): Payer: Self-pay | Admitting: Obstetrics and Gynecology

## 2019-11-13 ENCOUNTER — Inpatient Hospital Stay (HOSPITAL_COMMUNITY): Payer: Medicaid Other

## 2019-11-13 ENCOUNTER — Other Ambulatory Visit: Payer: Self-pay

## 2019-11-13 ENCOUNTER — Inpatient Hospital Stay (HOSPITAL_COMMUNITY)
Admission: AD | Admit: 2019-11-13 | Discharge: 2019-11-13 | Disposition: A | Discharge status: Home / Self Care | Payer: Medicaid Other | Attending: Obstetrics and Gynecology | Admitting: Obstetrics and Gynecology

## 2019-11-13 DIAGNOSIS — O98512 Other viral diseases complicating pregnancy, second trimester: Secondary | ICD-10-CM | POA: Diagnosis not present

## 2019-11-13 DIAGNOSIS — Z791 Long term (current) use of non-steroidal anti-inflammatories (NSAID): Secondary | ICD-10-CM | POA: Insufficient documentation

## 2019-11-13 DIAGNOSIS — O23591 Infection of other part of genital tract in pregnancy, first trimester: Secondary | ICD-10-CM | POA: Insufficient documentation

## 2019-11-13 DIAGNOSIS — O26891 Other specified pregnancy related conditions, first trimester: Secondary | ICD-10-CM | POA: Diagnosis not present

## 2019-11-13 DIAGNOSIS — Z79899 Other long term (current) drug therapy: Secondary | ICD-10-CM | POA: Diagnosis not present

## 2019-11-13 DIAGNOSIS — O99891 Other specified diseases and conditions complicating pregnancy: Secondary | ICD-10-CM

## 2019-11-13 DIAGNOSIS — Z3A01 Less than 8 weeks gestation of pregnancy: Secondary | ICD-10-CM

## 2019-11-13 DIAGNOSIS — B009 Herpesviral infection, unspecified: Secondary | ICD-10-CM | POA: Diagnosis not present

## 2019-11-13 DIAGNOSIS — B9689 Other specified bacterial agents as the cause of diseases classified elsewhere: Secondary | ICD-10-CM | POA: Insufficient documentation

## 2019-11-13 DIAGNOSIS — R109 Unspecified abdominal pain: Secondary | ICD-10-CM | POA: Diagnosis not present

## 2019-11-13 DIAGNOSIS — O99331 Smoking (tobacco) complicating pregnancy, first trimester: Secondary | ICD-10-CM | POA: Insufficient documentation

## 2019-11-13 DIAGNOSIS — F17210 Nicotine dependence, cigarettes, uncomplicated: Secondary | ICD-10-CM | POA: Insufficient documentation

## 2019-11-13 DIAGNOSIS — N760 Acute vaginitis: Secondary | ICD-10-CM | POA: Diagnosis not present

## 2019-11-13 DIAGNOSIS — O26899 Other specified pregnancy related conditions, unspecified trimester: Secondary | ICD-10-CM

## 2019-11-13 LAB — URINALYSIS, ROUTINE W REFLEX MICROSCOPIC
Bilirubin Urine: NEGATIVE
Glucose, UA: NEGATIVE mg/dL
Hgb urine dipstick: NEGATIVE
Ketones, ur: NEGATIVE mg/dL
Leukocytes,Ua: NEGATIVE
Nitrite: NEGATIVE
Protein, ur: NEGATIVE mg/dL
Specific Gravity, Urine: 1.019 (ref 1.005–1.030)
pH: 6 (ref 5.0–8.0)

## 2019-11-13 LAB — POCT PREGNANCY, URINE: Preg Test, Ur: POSITIVE — AB

## 2019-11-13 LAB — WET PREP, GENITAL
Sperm: NONE SEEN
Trich, Wet Prep: NONE SEEN
Yeast Wet Prep HPF POC: NONE SEEN

## 2019-11-13 LAB — CBC
HCT: 41.5 % (ref 36.0–46.0)
Hemoglobin: 13.2 g/dL (ref 12.0–15.0)
MCH: 30.2 pg (ref 26.0–34.0)
MCHC: 31.8 g/dL (ref 30.0–36.0)
MCV: 95 fL (ref 80.0–100.0)
Platelets: 333 10*3/uL (ref 150–400)
RBC: 4.37 MIL/uL (ref 3.87–5.11)
RDW: 14 % (ref 11.5–15.5)
WBC: 5.9 10*3/uL (ref 4.0–10.5)
nRBC: 0 % (ref 0.0–0.2)

## 2019-11-13 LAB — ABO/RH: ABO/RH(D): A POS

## 2019-11-13 LAB — HCG, QUANTITATIVE, PREGNANCY: hCG, Beta Chain, Quant, S: 567 m[IU]/mL — ABNORMAL HIGH (ref <5)

## 2019-11-13 MED ORDER — METRONIDAZOLE 0.75 % VA GEL: 1.0000 | Freq: Two times a day (BID) | VAGINAL | 0 refills | Status: DC

## 2019-11-13 NOTE — MAU Note (Signed)
Presents with c/o abdominal pain that began Sunday.  Reports she's very "gassy" until she voids, states feels like period cramps.  Denies VB.  LMP 10/06/2019.  +HPT.

## 2019-11-13 NOTE — MAU Provider Note (Signed)
Chief Complaint: Abdominal Pain   First Provider Initiated Contact with Patient 11/13/19 0914     SUBJECTIVE HPI: Janet Rodriguez is a 29 y.o. [id number] at [redacted]w[redacted]d who presents to Maternity Admissions reporting lower abdominal cramping, increased vaginal discharge and occasional gassiness (none now). Denies VB, n/v/d or recent illness. She describes the pain as diffuse across the lower abdomen, no localization.  Past Medical History:  Diagnosis Date  . BV (bacterial vaginosis)   . Headache(784.0)   . Herpes   . History of pelvic fracture   . Infection    OB History  Gravida Para Term Preterm AB Living  4 2 2   1 2   SAB TAB Ectopic Multiple Live Births  1       2    # Outcome Date GA Lbr Len/2nd Weight Sex Delivery Anes PTL Lv  4 Current           3 Term 07/09/12 [redacted]w[redacted]d 379:25 / 00:31 6 lb 4.5 oz (2.849 kg) F Vag-Spont EPI  LIV     Birth Comments: WNL  2 Term 2012     Vag-Spont   LIV  1 SAB            Past Surgical History:  Procedure Laterality Date  . ROOT CANAL     Social History   Socioeconomic History  . Marital status: Single    Spouse name: Not on file  . Number of children: Not on file  . Years of education: Not on file  . Highest education level: Not on file  Occupational History  . Not on file  Tobacco Use  . Smoking status: Current Every Day Smoker    Packs/day: 0.25    Years: 10.00    Pack years: 2.50    Types: Cigarettes    Last attempt to quit: 10/28/2011    Years since quitting: 8.0  . Smokeless tobacco: Never Used  Vaping Use  . Vaping Use: Never used  Substance and Sexual Activity  . Alcohol use: Not Currently    Comment: occ  . Drug use: No  . Sexual activity: Yes    Birth control/protection: None  Other Topics Concern  . Not on file  Social History Narrative  . Not on file   Social Determinants of Health   Financial Resource Strain:   . Difficulty of Paying Living Expenses: Not on file  Food Insecurity:   . Worried About 10/30/2011 in the Last Year: Not on file  . Ran Out of Food in the Last Year: Not on file  Transportation Needs:   . Lack of Transportation (Medical): Not on file  . Lack of Transportation (Non-Medical): Not on file  Physical Activity:   . Days of Exercise per Week: Not on file  . Minutes of Exercise per Session: Not on file  Stress:   . Feeling of Stress : Not on file  Social Connections:   . Frequency of Communication with Friends and Family: Not on file  . Frequency of Social Gatherings with Friends and Family: Not on file  . Attends Religious Services: Not on file  . Active Member of Clubs or Organizations: Not on file  . Attends Brewing technologist Meetings: Not on file  . Marital Status: Not on file  Intimate Partner Violence:   . Fear of Current or Ex-Partner: Not on file  . Emotionally Abused: Not on file  . Physically Abused: Not on file  . Sexually Abused:  Not on file   Family History  Problem Relation Age of Onset  . Cancer Maternal Grandfather   . Cancer Paternal Grandmother   . Healthy Mother   . Healthy Father    No current facility-administered medications on file prior to encounter.   Current Outpatient Medications on File Prior to Encounter  Medication Sig Dispense Refill  . Prenatal Vit-Fe Fumarate-FA (PRENATAL VITAMIN) 27-0.8 MG TABS Take 1 tablet by mouth daily. 90 tablet 2  . acetaminophen (TYLENOL) 325 MG tablet Take 650 mg by mouth every 6 (six) hours as needed.    . cyclobenzaprine (FLEXERIL) 10 MG tablet Take 1 tablet by mouth 3 times daily for muscle spasm. Be aware, this medication may cause excessive drowsiness. 21 tablet 0  . diclofenac (VOLTAREN) 75 MG EC tablet Take 1 tablet (75 mg total) by mouth 2 (two) times daily. 14 tablet 0   No Known Allergies  I have reviewed patient's Past Medical Hx, Surgical Hx, Family Hx, Social Hx, medications and allergies.   Review of Systems  Constitutional: Positive for appetite change (hungrier with pregnancy).  Negative for fever.  Respiratory: Negative for cough and shortness of breath.   Gastrointestinal: Positive for abdominal pain (lower abdominal like menstrual cramps) and constipation (occasional). Negative for diarrhea and nausea.  Genitourinary: Positive for frequency and vaginal discharge. Negative for decreased urine volume and vaginal bleeding.  Musculoskeletal: Negative for back pain.  Neurological: Negative for dizziness, light-headedness and headaches.  All other systems reviewed and are negative.   OBJECTIVE Patient Vitals for the past 24 hrs:  BP Temp Temp src Pulse Resp SpO2 Height Weight  11/13/19 0908 105/62 98.5 F (36.9 C) Oral 63 19 100 % 5' 5.5" (1.664 m) 121 lb 1.6 oz (54.9 kg)   Constitutional: Well-developed, well-nourished female in no acute distress.  Cardiovascular: normal rate & rhythm, no murmur Respiratory: normal rate and effort. Lung sounds clear throughout GI: Abd soft, non-tender, Pos BS x 4. No guarding or rebound tenderness MS: Extremities nontender, no edema, normal ROM Neurologic: Alert and oriented x 4.  SPECULUM EXAM: NEFG, moderate Schomer discharge, no blood noted, cervix clean   LAB RESULTS Results for orders placed or performed during the hospital encounter of 11/13/19 (from the past 24 hour(s))  Pregnancy, urine POC     Status: Abnormal   Collection Time: 11/13/19  8:47 AM  Result Value Ref Range   Preg Test, Ur POSITIVE (A) NEGATIVE  Urinalysis, Routine w reflex microscopic Urine, Clean Catch     Status: None   Collection Time: 11/13/19  8:49 AM  Result Value Ref Range   Color, Urine YELLOW YELLOW   APPearance CLEAR CLEAR   Specific Gravity, Urine 1.019 1.005 - 1.030   pH 6.0 5.0 - 8.0   Glucose, UA NEGATIVE NEGATIVE mg/dL   Hgb urine dipstick NEGATIVE NEGATIVE   Bilirubin Urine NEGATIVE NEGATIVE   Ketones, ur NEGATIVE NEGATIVE mg/dL   Protein, ur NEGATIVE NEGATIVE mg/dL   Nitrite NEGATIVE NEGATIVE   Leukocytes,Ua NEGATIVE NEGATIVE   Wet prep, genital     Status: Abnormal   Collection Time: 11/13/19  9:30 AM   Specimen: PATH Cytology Cervicovaginal Ancillary Only  Result Value Ref Range   Yeast Wet Prep HPF POC NONE SEEN NONE SEEN   Trich, Wet Prep NONE SEEN NONE SEEN   Clue Cells Wet Prep HPF POC PRESENT (A) NONE SEEN   WBC, Wet Prep HPF POC MANY (A) NONE SEEN   Sperm NONE SEEN  CBC     Status: None   Collection Time: 11/13/19  9:56 AM  Result Value Ref Range   WBC 5.9 4.0 - 10.5 K/uL   RBC 4.37 3.87 - 5.11 MIL/uL   Hemoglobin 13.2 12.0 - 15.0 g/dL   HCT 15.4 36 - 46 %   MCV 95.0 80.0 - 100.0 fL   MCH 30.2 26.0 - 34.0 pg   MCHC 31.8 30.0 - 36.0 g/dL   RDW 00.8 67.6 - 19.5 %   Platelets 333 150 - 400 K/uL   nRBC 0.0 0.0 - 0.2 %  ABO/Rh     Status: None   Collection Time: 11/13/19  9:56 AM  Result Value Ref Range   ABO/RH(D) A POS    No rh immune globuloin      NOT A RH IMMUNE GLOBULIN CANDIDATE, PT RH POSITIVE Performed at [facility], [address], [place], [place] [address]   hCG, quantitative, pregnancy     Status: Abnormal   Collection Time: 11/13/19  9:56 AM  Result Value Ref Range   hCG, Beta Chain, Quant, S 567 (H) <5 mIU/mL    IMAGING US OB LESS THAN 14 WEEKS WITH OB TRANSVAGINAL  Result Date: 11/13/2019 CLINICAL DATA:  Abdominal cramping, pregnancy EXAM: OBSTETRIC <14 WK ULTRASOUND TECHNIQUE: Transabdominal ultrasound was performed for evaluation of the gestation as well as the maternal uterus and adnexal regions. COMPARISON:  None. FINDINGS: Intrauterine gestational sac: Single candidate intrauterine gestational sac Yolk sac:  Not Visualized. Embryo:  Not Visualized. Cardiac Activity: Not Visualized. Heart Rate: Not visualized. MSD:  3 mm   4 w   6 d CRL: Not visualized [place] EDC: 07/16/2020 Subchorionic hemorrhage:  None visualized. Maternal uterus/adnexae: Unremarkable. IMPRESSION: Single candidate intrauterine gestational sac without identified embryo, sonographic gestational age of [redacted]  weeks, 6 days. Very early candidate intrauterine pregnancy of uncertain viability. Recommend follow-up ultrasound examination in 7-14 days to assess for continued development and viability. EDD 07/16/2020. Electronically Signed   By: Lauralyn Primes M.D.   On: 11/13/2019 10:50    MAU COURSE Orders Placed This Encounter  Procedures  . Wet prep, genital  . US OB LESS THAN 14 WEEKS WITH OB TRANSVAGINAL  . Urinalysis, Routine w reflex microscopic Urine, Clean Catch  . CBC  . hCG, quantitative, pregnancy  . Pregnancy, urine POC  . ABO/Rh  . Discharge patient   Meds ordered this encounter  Medications  . metroNIDAZOLE (METROGEL VAGINAL) 0.75 % vaginal gel    Sig: Place 1 Applicatorful vaginally 2 (two) times daily.    Dispense:  70 g    Refill:  0    Order Specific Question:   Supervising Provider    Answer:   Samara Snide    MDM Ectopic workup (all normal/negative) - U/S showed single gestational sac.  Wet prep, gc/ct - positive for BV  Discussed results with patient, she prefers the metrogel vs flagyl and stated this is a recurrent problem for her in pregnancy. She likes natural treatments for things when possible, advised on use of tea tree via tampon for BV prevention.  ASSESSMENT 1. Bacterial vaginosis   2. Abdominal cramping affecting pregnancy   3. [redacted] weeks gestation of pregnancy   4. Abdominal pain during pregnancy in first trimester     PLAN Discharge home in stable condition with bleeding/preterm labor precautions.   Follow-up Information    [facility], [place]. Call.   Specialty: Obstetrics and Gynecology Why: to establish routine  prenatal care Contact information: [address] [place] [address] [phone]              Allergies as of 11/13/2019   No Known Allergies     Medication List    STOP taking these medications   acetaminophen 325 MG tablet Commonly known as: TYLENOL   cyclobenzaprine 10 MG tablet Commonly  known as: FLEXERIL   diclofenac 75 MG EC tablet Commonly known as: VOLTAREN     TAKE these medications   metroNIDAZOLE 0.75 % vaginal gel Commonly known as: METROGEL VAGINAL Place 1 Applicatorful vaginally 2 (two) times daily.   Prenatal Vitamin 27-0.8 MG Tabs Take 1 tablet by mouth daily.       Edd Arbour, CNM, MSN, [facility] 11/13/19 11:34 AM

## 2019-11-13 NOTE — Discharge Instructions (Signed)
Non-Pharmacological Prevention of Bacterial Vaginosis (BV)  - Melt 1/4c of virgin coconut oil - Add 2-3 drops tea tree essential oil and swirl in - Soak a tampon in the mixture - Insert overnight - once a month after your period has ended for prevention, or monthly during pregnancy   Bacterial Vaginosis  Bacterial vaginosis is an infection of the vagina. It happens when too many normal germs (healthy bacteria) grow in the vagina. This infection puts you at risk for infections from sex (STIs). Treating this infection can lower your risk for some STIs. You should also treat this if you are pregnant. It can cause your baby to be born early. Follow these instructions at home: Medicines  Take over-the-counter and prescription medicines only as told by your doctor.  Take or use your antibiotic medicine as told by your doctor. Do not stop taking or using it even if you start to feel better. General instructions  If you your sexual partner is a woman, tell her that you have this infection. She needs to get treatment if she has symptoms. If you have a female partner, he does not need to be treated.  During treatment: ? Avoid sex. ? Do not douche. ? Avoid alcohol as told. ? Avoid breastfeeding as told.  Drink enough fluid to keep your pee (urine) clear or pale yellow.  Keep your vagina and butt (rectum) clean. ? Wash the area with warm water every day. ? Wipe from front to back after you use the toilet.  Keep all follow-up visits as told by your doctor. This is important. Preventing this condition  Do not douche.  Use only warm water to wash around your vagina.  Use protection when you have sex. This includes: ? Latex condoms. ? Dental dams.  Limit how many people you have sex with. It is best to only have sex with the same person (be monogamous).  Get tested for STIs. Have your partner get tested.  Wear underwear that is cotton or lined with cotton.  Avoid tight pants and  pantyhose. This is most important in summer.  Do not use any products that have nicotine or tobacco in them. These include cigarettes and e-cigarettes. If you need help quitting, ask your doctor.  Do not use illegal drugs.  Limit how much alcohol you drink. Contact a doctor if:  Your symptoms do not get better, even after you are treated.  You have more discharge or pain when you pee (urinate).  You have a fever.  You have pain in your belly (abdomen).  You have pain with sex.  Your bleed from your vagina between periods. Summary  This infection happens when too many germs (bacteria) grow in the vagina.  Treating this condition can lower your risk for some infections from sex (STIs).  You should also treat this if you are pregnant. It can cause early (premature) birth.  Do not stop taking or using your antibiotic medicine even if you start to feel better. This information is not intended to replace advice given to you by your health care provider. Make sure you discuss any questions you have with your health care provider. Document Revised: 01/11/2017 Document Reviewed: 10/15/2015 Elsevier Patient Education  2020 [employer].   First Trimester of Pregnancy  The first trimester of pregnancy is from week 1 until the end of week 13 (months 1 through 3). During this time, your baby will begin to develop inside you. At 6-8 weeks, the eyes and face  are formed, and the heartbeat can be seen on ultrasound. At the end of 12 weeks, all the baby's organs are formed. Prenatal care is all the medical care you receive before the birth of your baby. Make sure you get good prenatal care and follow all of your doctor's instructions. Follow these instructions at home: Medicines  Take over-the-counter and prescription medicines only as told by your doctor. Some medicines are safe and some medicines are not safe during pregnancy.  Take a prenatal vitamin that contains at least 600 micrograms  (mcg) of folic acid.  If you have trouble pooping (constipation), take medicine that will make your stool soft (stool softener) if your doctor approves. Eating and drinking   Eat regular, healthy meals.  Your doctor will tell you the amount of weight gain that is right for you.  Avoid raw meat and uncooked cheese.  If you feel sick to your stomach (nauseous) or throw up (vomit): ? Eat 4 or 5 small meals a day instead of 3 large meals. ? Try eating a few soda crackers. ? Drink liquids between meals instead of during meals.  To prevent constipation: ? Eat foods that are high in fiber, like fresh fruits and vegetables, whole grains, and beans. ? Drink enough fluids to keep your pee (urine) clear or pale yellow. Activity  Exercise only as told by your doctor. Stop exercising if you have cramps or pain in your lower belly (abdomen) or low back.  Do not exercise if it is too hot, too humid, or if you are in a place of great height (high altitude).  Try to avoid standing for long periods of time. Move your legs often if you must stand in one place for a long time.  Avoid heavy lifting.  Wear low-heeled shoes. Sit and stand up straight.  You can have sex unless your doctor tells you not to. Relieving pain and discomfort  Wear a good support bra if your breasts are sore.  Take warm water baths (sitz baths) to soothe pain or discomfort caused by hemorrhoids. Use hemorrhoid cream if your doctor says it is okay.  Rest with your legs raised if you have leg cramps or low back pain.  If you have puffy, bulging veins (varicose veins) in your legs: ? Wear support hose or compression stockings as told by your doctor. ? Raise (elevate) your feet for 15 minutes, 3-4 times a day. ? Limit salt in your food. Prenatal care  Schedule your prenatal visits by the twelfth week of pregnancy.  Write down your questions. Take them to your prenatal visits.  Keep all your prenatal visits as told by  your doctor. This is important. Safety  Wear your seat belt at all times when driving.  Make a list of emergency phone numbers. The list should include numbers for family, friends, the hospital, and police and fire departments. General instructions  Ask your doctor for a referral to a local prenatal class. Begin classes no later than at the start of month 6 of your pregnancy.  Ask for help if you need counseling or if you need help with nutrition. Your doctor can give you advice or tell you where to go for help.  Do not use hot tubs, steam rooms, or saunas.  Do not douche or use tampons or scented sanitary pads.  Do not cross your legs for long periods of time.  Avoid all herbs and alcohol. Avoid drugs that are not approved by your doctor.  Do not use any tobacco products, including cigarettes, chewing tobacco, and electronic cigarettes. If you need help quitting, ask your doctor. You may get counseling or other support to help you quit.  Avoid cat litter boxes and soil used by cats. These carry germs that can cause birth defects in the baby and can cause a loss of your baby (miscarriage) or stillbirth.  Visit your dentist. At home, brush your teeth with a soft toothbrush. Be gentle when you floss. Contact a doctor if:  You are dizzy.  You have mild cramps or pressure in your lower belly.  You have a nagging pain in your belly area.  You continue to feel sick to your stomach, you throw up, or you have watery poop (diarrhea).  You have a bad smelling fluid coming from your vagina.  You have pain when you pee (urinate).  You have increased puffiness (swelling) in your face, hands, legs, or ankles. Get help right away if:  You have a fever.  You are leaking fluid from your vagina.  You have spotting or bleeding from your vagina.  You have very bad belly cramping or pain.  You gain or lose weight rapidly.  You throw up blood. It may look like coffee grounds.  You are  around people who have [place] measles, fifth disease, or chickenpox.  You have a very bad headache.  You have shortness of breath.  You have any kind of trauma, such as from a fall or a car accident. Summary  The first trimester of pregnancy is from week 1 until the end of week 13 (months 1 through 3).  To take care of yourself and your unborn baby, you will need to eat healthy meals, take medicines only if your doctor tells you to do so, and do activities that are safe for you and your baby.  Keep all follow-up visits as told by your doctor. This is important as your doctor will have to ensure that your baby is healthy and growing well. This information is not intended to replace advice given to you by your health care provider. Make sure you discuss any questions you have with your health care provider. Document Revised: 05/22/2018 Document Reviewed: 02/07/2016 Elsevier Patient Education  2020 [employer].

## 2019-11-16 LAB — GC/CHLAMYDIA PROBE AMP (~~LOC~~) NOT AT ARMC
Chlamydia: NEGATIVE
Comment: NEGATIVE
Comment: NORMAL
Neisseria Gonorrhea: NEGATIVE

## 2019-11-17 ENCOUNTER — Telehealth: Payer: Self-pay

## 2019-11-17 DIAGNOSIS — Z3200 Encounter for pregnancy test, result unknown: Secondary | ICD-10-CM

## 2019-11-17 DIAGNOSIS — Z3201 Encounter for pregnancy test, result positive: Secondary | ICD-10-CM

## 2019-11-17 NOTE — Telephone Encounter (Signed)
Orders has been placed for u/s

## 2019-11-17 NOTE — Telephone Encounter (Signed)
Patient was seen at [facility] and advised to follow up with u/s. I will placed the order and have front staff reach out and schedule.

## 2019-11-30 ENCOUNTER — Other Ambulatory Visit: Payer: Self-pay

## 2019-11-30 ENCOUNTER — Telehealth: Payer: Self-pay

## 2019-11-30 ENCOUNTER — Ambulatory Visit
Admission: RE | Admit: 2019-11-30 | Discharge: 2019-11-30 | Disposition: A | Discharge status: Home / Self Care | Payer: Medicaid Other | Source: Ambulatory Visit | Attending: Certified Nurse Midwife | Admitting: Certified Nurse Midwife

## 2019-11-30 ENCOUNTER — Other Ambulatory Visit: Payer: Self-pay | Admitting: Obstetrics

## 2019-11-30 ENCOUNTER — Encounter: Payer: Self-pay | Admitting: Obstetrics

## 2019-11-30 ENCOUNTER — Inpatient Hospital Stay (HOSPITAL_COMMUNITY)
Admission: AD | Admit: 2019-11-30 | Discharge: 2019-11-30 | Disposition: A | Discharge status: Home / Self Care | Payer: Medicaid Other | Attending: Obstetrics & Gynecology | Admitting: Obstetrics & Gynecology

## 2019-11-30 ENCOUNTER — Telehealth: Payer: Self-pay | Admitting: Obstetrics

## 2019-11-30 ENCOUNTER — Telehealth (INDEPENDENT_AMBULATORY_CARE_PROVIDER_SITE_OTHER): Payer: Medicaid Other | Admitting: Obstetrics

## 2019-11-30 DIAGNOSIS — O021 Missed abortion: Secondary | ICD-10-CM | POA: Diagnosis not present

## 2019-11-30 DIAGNOSIS — Z3A01 Less than 8 weeks gestation of pregnancy: Secondary | ICD-10-CM | POA: Insufficient documentation

## 2019-11-30 DIAGNOSIS — O039 Complete or unspecified spontaneous abortion without complication: Secondary | ICD-10-CM | POA: Diagnosis not present

## 2019-11-30 DIAGNOSIS — Z369 Encounter for antenatal screening, unspecified: Secondary | ICD-10-CM | POA: Diagnosis not present

## 2019-11-30 DIAGNOSIS — Z3200 Encounter for pregnancy test, result unknown: Secondary | ICD-10-CM | POA: Diagnosis not present

## 2019-11-30 LAB — CBC
HCT: 41.9 % (ref 36.0–46.0)
Hemoglobin: 13.5 g/dL (ref 12.0–15.0)
MCH: 30.3 pg (ref 26.0–34.0)
MCHC: 32.2 g/dL (ref 30.0–36.0)
MCV: 94.2 fL (ref 80.0–100.0)
Platelets: 310 10*3/uL (ref 150–400)
RBC: 4.45 MIL/uL (ref 3.87–5.11)
RDW: 13.7 % (ref 11.5–15.5)
WBC: 8.1 10*3/uL (ref 4.0–10.5)
nRBC: 0 % (ref 0.0–0.2)

## 2019-11-30 LAB — COMPREHENSIVE METABOLIC PANEL
ALT: 8 U/L (ref 0–44)
AST: 16 U/L (ref 15–41)
Albumin: 4.1 g/dL (ref 3.5–5.0)
Alkaline Phosphatase: 36 U/L — ABNORMAL LOW (ref 38–126)
Anion gap: 8 (ref 5–15)
BUN: 6 mg/dL (ref 6–20)
CO2: 29 mmol/L (ref 22–32)
Calcium: 9.4 mg/dL (ref 8.9–10.3)
Chloride: 105 mmol/L (ref 98–111)
Creatinine, Ser: 0.77 mg/dL (ref 0.44–1.00)
GFR, Estimated: >60 mL/min (ref >60)
Glucose, Bld: 96 mg/dL (ref 70–99)
Potassium: 3.5 mmol/L (ref 3.5–5.1)
Sodium: 142 mmol/L (ref 135–145)
Total Bilirubin: 0.6 mg/dL (ref 0.3–1.2)
Total Protein: 7 g/dL (ref 6.5–8.1)

## 2019-11-30 LAB — HCG, QUANTITATIVE, PREGNANCY: hCG, Beta Chain, Quant, S: 4 m[IU]/mL (ref <5)

## 2019-11-30 NOTE — Telephone Encounter (Signed)
Call received from Dr.Brown regarding pt critical urgent U/S results. Dr.Brown made aware I will consult w/provider on directions and pt has appt to discuss results.  However, results were printed as soon as available since critical and given to Dr.Harper to advise now. I called patient to speak with Dr.Harper regarding urgent results.

## 2019-11-30 NOTE — Progress Notes (Signed)
TELEHEALTH GYNECOLOGY VISIT ENCOUNTER NOTE  I connected with [name] on 11/30/19 at  4:00 PM EDT by telephone at home and verified that I am speaking with the correct person using two identifiers.   I discussed the limitations, risks, security and privacy concerns of performing an evaluation and management service by telephone and the availability of in person appointments. I also discussed with the patient that there may be a patient responsible charge related to this service. The patient expressed understanding and agreed to proceed.   History:  Janet Rodriguez is a 29 y.o. [phone] female being evaluated today for fetal viability. She denies any abnormal vaginal discharge, bleeding, pelvic pain or other concerns.       Past Medical History:  Diagnosis Date  . BV (bacterial vaginosis)   . Headache(784.0)   . Herpes   . History of pelvic fracture   . Infection    Past Surgical History:  Procedure Laterality Date  . ROOT CANAL     The following portions of the patient's history were reviewed and updated as appropriate: allergies, current medications, past family history, past medical history, past social history, past surgical history and problem list.   Health Maintenance:  Normal pap and negative HRHPV on 10-14-2013.  Review of Systems:  Pertinent items noted in HPI and remainder of comprehensive ROS otherwise negative.  Physical Exam:   General:  Alert, oriented and cooperative.   Mental Status: Normal mood and affect perceived. Normal judgment and thought content.  Physical exam deferred due to nature of the encounter  Labs and Imaging Results for orders placed or performed during the hospital encounter of 11/30/19 (from the past 336 hour(s))  CBC   Collection Time: 11/30/19  2:27 PM  Result Value Ref Range   WBC 8.1 4.0 - 10.5 K/uL   RBC 4.45 3.87 - 5.11 MIL/uL   Hemoglobin 13.5 12.0 - 15.0 g/dL   HCT 45.4 36 - 46 %   MCV 94.2 80.0 - 100.0 fL   MCH 30.3  26.0 - 34.0 pg   MCHC 32.2 30.0 - 36.0 g/dL   RDW 09.8 11.9 - 14.7 %   Platelets 310 150 - 400 K/uL   nRBC 0.0 0.0 - 0.2 %  Comprehensive metabolic panel   Collection Time: 11/30/19  2:27 PM  Result Value Ref Range   Sodium 142 135 - 145 mmol/L   Potassium 3.5 3.5 - 5.1 mmol/L   Chloride 105 98 - 111 mmol/L   CO2 29 22 - 32 mmol/L   Glucose, Bld 96 70 - 99 mg/dL   BUN 6 6 - 20 mg/dL   Creatinine, Ser 8.29 0.44 - 1.00 mg/dL   Calcium 9.4 8.9 - 56.2 mg/dL   Total Protein 7.0 6.5 - 8.1 g/dL   Albumin 4.1 3.5 - 5.0 g/dL   AST 16 15 - 41 U/L   ALT 8 0 - 44 U/L   Alkaline Phosphatase 36 (L) 38 - 126 U/L   Total Bilirubin 0.6 0.3 - 1.2 mg/dL   GFR, Estimated >13 >08 mL/min   Anion gap 8 5 - 15  hCG, quantitative, pregnancy   Collection Time: 11/30/19  2:27 PM  Result Value Ref Range   hCG, Beta Chain, Quant, S 4 <5 mIU/mL   US OB Transvaginal  Result Date: 11/30/2019 CLINICAL DATA:  Followup for early pregnancy. Quantitative beta HCG on 11/13/2019 was 567. LMP was 10/06/2019. EDC by LMP is 07/13/2019. Patient is 7 weeks 6  days by LMP. EXAM: TRANSVAGINAL OB ULTRASOUND TECHNIQUE: Transvaginal ultrasound was performed for complete evaluation of the gestation as well as the maternal uterus, adnexal regions, and pelvic cul-de-sac. COMPARISON:  Prior study on 11/13/2019 FINDINGS: Intrauterine gestational sac: None. The questioned intrauterine gestational sac seen on the previous exam is not present today. Yolk sac:  Not Visualized. Embryo:  Not Visualized. Cardiac Activity: Not Visualized. Subchorionic hemorrhage:  None visualized. Maternal uterus/adnexae: Trace free pelvic fluid. RIGHT ovary: Small follicles are present. A small soft tissue mass is identified adjacent to the RIGHT ovary and is possibly separate from the ovary, measuring 1.9 x 1.1 x 1.6 centimeters. Internal blood flow is identified on Doppler evaluation. Considerations include corpus luteum or ectopic pregnancy. LEFT ovary  contains small follicles. A soft tissue mass is identified in the LEFT adnexal region and possibly within a fluid filled tubular structure. This mass measures 1.6 x 0.6 x 1.2 centimeters. No definitive internal blood flow identified in this mass. IMPRESSION: 1. No intrauterine gestational sac. 2. Soft tissue mass adjacent to the RIGHT ovary, consistent with corpus luteum cyst or ectopic pregnancy. 3. Soft tissue mass in the LEFT adnexal region, raising the question of ectopic pregnancy versus debris within the fallopian tube. 4. Recommend close follow-up with quantitative beta HCG measurements and ultrasound studies. The patient is scheduled to be seen at the [facility] clinic today 4 o'clock p.m. 5. Critical Value/emergent results were called by telephone at the time of interpretation on 11/30/2019 at 11:33 am to the triage nurse at [facility], who verbally acknowledged these results. At her request, the patient was released from the ultrasound department and advised to keep her 4 o'clock appointment today. Electronically Signed   By: Norva Pavlov M.D.   On: 11/30/2019 11:35   US OB LESS THAN 14 WEEKS WITH OB TRANSVAGINAL  Result Date: 11/13/2019 CLINICAL DATA:  Abdominal cramping, pregnancy EXAM: OBSTETRIC <14 WK ULTRASOUND TECHNIQUE: Transabdominal ultrasound was performed for evaluation of the gestation as well as the maternal uterus and adnexal regions. COMPARISON:  None. FINDINGS: Intrauterine gestational sac: Single candidate intrauterine gestational sac Yolk sac:  Not Visualized. Embryo:  Not Visualized. Cardiac Activity: Not Visualized. Heart Rate: Not visualized. MSD:  3 mm   4 w   6 d CRL: Not visualized [place] EDC: 07/16/2020 Subchorionic hemorrhage:  None visualized. Maternal uterus/adnexae: Unremarkable. IMPRESSION: Single candidate intrauterine gestational sac without identified embryo, sonographic gestational age of [redacted] weeks, 6 days. Very early candidate intrauterine pregnancy of uncertain viability.  Recommend follow-up ultrasound examination in 7-14 days to assess for continued development and viability. EDD 07/16/2020. Electronically Signed   By: Lauralyn Primes M.D.   On: 11/13/2019 10:50      Assessment and Plan:     1. Missed abortion, complete - quantitative beta HCG dropped from 567 on 11-13-2019 to 4 today, and she is asymptomatic; therefore ectopic pregnancy is ruled out. - follow up in 2 weeks      I discussed the assessment and treatment plan with the patient. The patient was provided an opportunity to ask questions and all were answered. The patient agreed with the plan and demonstrated an understanding of the instructions.   The patient was advised to call back or seek an in-person evaluation/go to the ED if the symptoms worsen or if the condition fails to improve as anticipated.  I provided 10 minutes of non-face-to-face time during this encounter.   Coral Ceo, MD Center for [facility], [facility] 11/30/19

## 2019-11-30 NOTE — Telephone Encounter (Signed)
Received a critical / emergent call from ultrasound regarding ultrasound exam revealing no IUP and questionable adnexal masses.  Patient should be ~ 8 weeks by LMP and had a quantitative beta-HCG of 567 on 11-13-2019.  No follow up quant done. Patient sent to MAU for further evaluation, R/O ectopic pregnancy.

## 2019-11-30 NOTE — MAU Note (Signed)
.   Janet Rodriguez is a 29 y.o. at [redacted]w[redacted]d here in MAU reporting: she went for an U/S today for viability and they saw a mass. Pt says that sent her for BHCG. Denies any pain. Denies any VB LMP: 10/06/2019 Onset of complaint: today Pain score: 0 Vitals:   11/30/19 1316 11/30/19 1317  BP: (!) 118/44 (!) 111/58  Pulse: 77   Resp:    Temp:    SpO2: 98%      FHT:  Lab orders placed from triage:

## 2019-11-30 NOTE — MAU Provider Note (Signed)
Patient Janet Rodriguez is a 29 y.o. [phone]  at [redacted]w[redacted]d here for follow-up after viability scan today. She denies abdominal pain, vaginal bleeding. She is tearful and upset and wants to know "if everything is ok with my baby. "  She was seen and evaluated in MAU for possible ectopic pregnancy on November 13, 2019. She had a follow-up US today for viability. Results today showed possible LEFT and RIGHT ectopic masses; patient was notified and sent to MAU for evaluation. Dr. Adrian Blackwater aware of patient's [place] reports; will wait for bHCG to determine plan of care.    US OB Transvaginal  Result Date: 11/30/2019 CLINICAL DATA:  Followup for early pregnancy. Quantitative beta HCG on 11/13/2019 was 567. LMP was 10/06/2019. EDC by LMP is 07/13/2019. Patient is 7 weeks 6 days by LMP. EXAM: TRANSVAGINAL OB ULTRASOUND TECHNIQUE: Transvaginal ultrasound was performed for complete evaluation of the gestation as well as the maternal uterus, adnexal regions, and pelvic cul-de-sac. COMPARISON:  Prior study on 11/13/2019 FINDINGS: Intrauterine gestational sac: None. The questioned intrauterine gestational sac seen on the previous exam is not present today. Yolk sac:  Not Visualized. Embryo:  Not Visualized. Cardiac Activity: Not Visualized. Subchorionic hemorrhage:  None visualized. Maternal uterus/adnexae: Trace free pelvic fluid. RIGHT ovary: Small follicles are present. A small soft tissue mass is identified adjacent to the RIGHT ovary and is possibly separate from the ovary, measuring 1.9 x 1.1 x 1.6 centimeters. Internal blood flow is identified on Doppler evaluation. Considerations include corpus luteum or ectopic pregnancy. LEFT ovary contains small follicles. A soft tissue mass is identified in the LEFT adnexal region and possibly within a fluid filled tubular structure. This mass measures 1.6 x 0.6 x 1.2 centimeters. No definitive internal blood flow identified in this mass. IMPRESSION: 1. No intrauterine gestational sac.  2. Soft tissue mass adjacent to the RIGHT ovary, consistent with corpus luteum cyst or ectopic pregnancy. 3. Soft tissue mass in the LEFT adnexal region, raising the question of ectopic pregnancy versus debris within the fallopian tube. 4. Recommend close follow-up with quantitative beta HCG measurements and ultrasound studies. The patient is scheduled to be seen at the [facility] clinic today 4 o'clock p.m. 5. Critical Value/emergent results were called by telephone at the time of interpretation on 11/30/2019 at 11:33 am to the triage nurse at [facility], who verbally acknowledged these results. At her request, the patient was released from the ultrasound department and advised to keep her 4 o'clock appointment today. Electronically Signed   By: Norva Pavlov M.D.   On: 11/30/2019 11:35      Assessment and Plan   -Repeat BHCG is 4, indicating that patient is no longer pregnant and ectopic pregnancy is excluded. Explained results to patient, who is confused and upset. Patient and partner left without discharge papers, and did not want further information.    1. Miscarriage    2. Patient left AMA, did not receive discharge papers. Was not able to do discharge teaching or review precautions with patient.   3. Will send message to [facility] for patient to have follow-up visit in two weeks regarding SAB.   4. Dr. Adrian Blackwater aware of patient's status and departure from MAU.   Janet Rodriguez 11/30/2019, 3:39 PM

## 2019-12-14 ENCOUNTER — Other Ambulatory Visit: Payer: Self-pay

## 2019-12-14 ENCOUNTER — Ambulatory Visit (INDEPENDENT_AMBULATORY_CARE_PROVIDER_SITE_OTHER): Payer: Medicaid Other | Admitting: Obstetrics

## 2019-12-14 ENCOUNTER — Encounter: Payer: Self-pay | Admitting: Obstetrics

## 2019-12-14 VITALS — BP 114/68 | HR 75 | Wt 123.0 lb

## 2019-12-14 DIAGNOSIS — O021 Missed abortion: Secondary | ICD-10-CM

## 2019-12-14 NOTE — Progress Notes (Signed)
Patient ID: Janet Rodriguez, female   DOB: 21-Jul-1990, 29 y.o.   MRN: [id number]  Chief Complaint  Patient presents with  . Miscarriage    HPI Janet Rodriguez is a 29 y.o. female.  Presents after Missed AB ~ 2 weeks ago.  No complaints. HPI  Past Medical History:  Diagnosis Date  . BV (bacterial vaginosis)   . Headache(784.0)   . Herpes   . History of pelvic fracture   . Infection     Past Surgical History:  Procedure Laterality Date  . ROOT CANAL      Family History  Problem Relation Age of Onset  . Cancer Maternal Grandfather   . Cancer Paternal Grandmother   . Healthy Mother   . Healthy Father     Social History Social History   Tobacco Use  . Smoking status: Current Every Day Smoker    Packs/day: 0.25    Years: 10.00    Pack years: 2.50    Types: Cigarettes    Last attempt to quit: 10/28/2011    Years since quitting: 8.1  . Smokeless tobacco: Never Used  Vaping Use  . Vaping Use: Never used  Substance Use Topics  . Alcohol use: Not Currently    Comment: occ  . Drug use: No    No Known Allergies  Current Outpatient Medications  Medication Sig Dispense Refill  . Prenatal Vit-Fe Fumarate-FA (PRENATAL VITAMIN) 27-0.8 MG TABS Take 1 tablet by mouth daily. 90 tablet 2  . metroNIDAZOLE (METROGEL VAGINAL) 0.75 % vaginal gel Place 1 Applicatorful vaginally 2 (two) times daily. 70 g 0   No current facility-administered medications for this visit.    Review of Systems Review of Systems Constitutional: negative for fatigue and weight loss Respiratory: negative for cough and wheezing Cardiovascular: negative for chest pain, fatigue and palpitations Gastrointestinal: negative for abdominal pain and change in bowel habits Genitourinary:negative Integument/breast: negative for nipple discharge Musculoskeletal:negative for myalgias Neurological: negative for gait problems and tremors Behavioral/Psych: negative for abusive relationship, depression Endocrine:  negative for temperature intolerance      Blood pressure 114/68, pulse 75, weight 123 lb (55.8 kg), last menstrual period 10/06/2019, unknown if currently breastfeeding.  Physical Exam Physical Exam:  General:   alert and no distress  Skin:   no rash or abnormalities  Lungs:   clear to auscultation bilaterally  Heart:   regular rate and rhythm, S1, S2 normal, no murmur, click, rub or gallop  Breasts:   normal without suspicious masses, skin or nipple changes or axillary nodes  Abdomen:  normal findings: no organomegaly, soft, non-tender and no hernia  Pelvis:  External genitalia: normal general appearance Urinary system: urethral meatus normal and bladder without fullness, nontender Vaginal: normal without tenderness, induration or masses Cervix: normal appearance Adnexa: normal bimanual exam Uterus: anteverted and non-tender, normal size      50% of 15 min visit spent on counseling and coordination of care.   Data Reviewed Labs  Assessment     1. Missed abortion x 2 - informed that if next pregnancy ends in early miscarriage, then she will need a work-up for Habitual Aborter - patient wants to try and conceive ASAP - continue taking PNV's    Plan  Follow up in 6 weeks for Annual / Pap  Brock Bad, MD 12/14/2019 12:06 PM

## 2019-12-14 NOTE — Progress Notes (Signed)
29 y.o presents for SAB PHQ-9=4 Patient would like to do the Chromosome test today. Denies vaginal bleeding, pain. Reports no problems.

## 2020-02-13 NOTE — L&D Delivery Note (Addendum)
LABOR COURSE Patient presented to MAU on 9/12 with strong contractions every 2-3 minutes. On arrival, cervical exam was 4.5/80/-2. She SROMed in the MAU. Once arriving to L&D, she rapidly progressed to complete at 2257.   Delivery Note Called to room and patient was complete and pushing. Head delivered ROA. No nuchal cord present. Shoulder and body delivered in usual fashion. At  2336 a healthy female was delivered via Vaginal, Spontaneous vertex presentation.  Infant with spontaneous cry, placed on mother's abdomen, dried and stimulated. Cord clamped x 2 after 1-minute delay, and cut by FOB. Cord blood drawn. Placenta delivered spontaneously with gentle cord traction. Appears intact. Fundus firm with massage and Pitocin. Labia, perineum, vagina, and cervix inspected with no tears.    APGAR: 9, 9; weight pending.   Cord: 3VC with the following complications:none.    Anesthesia:  Epidural Episiotomy: None Lacerations: None Est. Blood Loss (mL): 5mL  Mom to postpartum.  Baby to Couplet care / Skin to Skin.  Alicia Amel, MD 10/26/20 11:52 PM    GME ATTESTATION:  I was gloved and present for the duration of delivery. I agree with the findings and the plan of care as documented in the resident's note.  Leticia Penna, DO OB Fellow, [facility], Center for [facility] 10/26/2020 2:03 AM

## 2020-02-24 ENCOUNTER — Other Ambulatory Visit: Payer: Self-pay

## 2020-02-24 ENCOUNTER — Inpatient Hospital Stay (HOSPITAL_COMMUNITY)
Admission: AD | Admit: 2020-02-24 | Discharge: 2020-02-24 | Disposition: A | Discharge status: Home / Self Care | Payer: Medicaid Other | Attending: Obstetrics and Gynecology | Admitting: Obstetrics and Gynecology

## 2020-02-24 DIAGNOSIS — N912 Amenorrhea, unspecified: Secondary | ICD-10-CM | POA: Diagnosis not present

## 2020-02-24 NOTE — MAU Note (Addendum)
Presents for pregnancy confirmation.  Reports LMP 12/18/2019.  Denies VB or pain. Covid + 02/18/2020.

## 2020-02-24 NOTE — MAU Provider Note (Signed)
   S Ms. Janet Rodriguez is a 30 y.o. [phone] patient who presents to MAU today with complaint of pregnancy. She denies bleeding, spotting, cramping, pain. No other medical complaints.   O BP (!) 106/58 (BP Location: Right Arm)   Pulse 62   Temp 98.7 F (37.1 C) (Oral)   Resp 18   Ht 5' 5.5" (1.664 m)   Wt 53.9 kg   LMP 12/18/2019   SpO2 100%   BMI 19.47 kg/m  Physical Exam Vitals reviewed.  Constitutional:      Appearance: Normal appearance.  Pulmonary:     Effort: Pulmonary effort is normal.  Abdominal:     General: Abdomen is flat.  Skin:    Capillary Refill: Capillary refill takes less than 2 seconds.  Neurological:     General: No focal deficit present.     Mental Status: She is alert.  Psychiatric:        Mood and Affect: Mood normal.        Behavior: Behavior normal.        Thought Content: Thought content normal.        Judgment: Judgment normal.     A Medical screening exam complete  P Discharge from MAU in stable condition Patient upset that I would not give her a pregnancy test here. States that she is high risk and needs to know if she is pregnant. I relayed that this is an emergency department for pregnant women and that, at this point, she has not presented with any emergent condition that would require treatment in the emergency department. I offered to arrange for her to get a pregnancy test with pregnancy confirmation letter in one of our offices. She declined and left. Warning signs for worsening condition that would warrant emergency follow-up discussed Patient may return to MAU as needed   Levie Heritage, DO 02/24/2020 12:37 PM

## 2020-02-24 NOTE — Progress Notes (Signed)
MSE performed by Dr. Adrian Blackwater.  POC & F/U discussed.

## 2020-02-26 ENCOUNTER — Ambulatory Visit (INDEPENDENT_AMBULATORY_CARE_PROVIDER_SITE_OTHER): Payer: Medicaid Other

## 2020-02-26 ENCOUNTER — Other Ambulatory Visit: Payer: Self-pay

## 2020-02-26 DIAGNOSIS — Z3200 Encounter for pregnancy test, result unknown: Secondary | ICD-10-CM

## 2020-02-26 LAB — POCT URINE PREGNANCY: Preg Test, Ur: POSITIVE — AB

## 2020-02-26 NOTE — Progress Notes (Signed)
..   Ms. Younes presents today for UPT. She has no unusual complaints. LMP:12-18-19    OBJECTIVE: Appears well, in no apparent distress.  OB History    Gravida  5   Para  2   Term  2   Preterm      AB  2   Living  2     SAB  2   IAB      Ectopic      Multiple      Live Births  2          Home UPT Result:Positive In-Office UPT result:Positive I have reviewed the patient's medical, obstetrical, social, and family histories, and medications.   ASSESSMENT: Positive pregnancy test  PLAN Prenatal care to be completed at: [facility]

## 2020-02-26 NOTE — Progress Notes (Signed)
Agree with A & P. 

## 2020-03-30 ENCOUNTER — Other Ambulatory Visit (HOSPITAL_COMMUNITY)
Admission: RE | Admit: 2020-03-30 | Discharge: 2020-03-30 | Disposition: A | Discharge status: Home / Self Care | Payer: Medicaid Other | Source: Ambulatory Visit | Attending: Obstetrics and Gynecology | Admitting: Obstetrics and Gynecology

## 2020-03-30 ENCOUNTER — Ambulatory Visit (INDEPENDENT_AMBULATORY_CARE_PROVIDER_SITE_OTHER): Payer: Medicaid Other | Admitting: Obstetrics and Gynecology

## 2020-03-30 ENCOUNTER — Encounter: Payer: Self-pay | Admitting: Obstetrics and Gynecology

## 2020-03-30 DIAGNOSIS — Z3490 Encounter for supervision of normal pregnancy, unspecified, unspecified trimester: Secondary | ICD-10-CM | POA: Diagnosis not present

## 2020-03-30 DIAGNOSIS — Z3A14 14 weeks gestation of pregnancy: Secondary | ICD-10-CM | POA: Diagnosis not present

## 2020-03-30 DIAGNOSIS — Z315 Encounter for genetic counseling: Secondary | ICD-10-CM | POA: Diagnosis not present

## 2020-03-30 DIAGNOSIS — Z3492 Encounter for supervision of normal pregnancy, unspecified, second trimester: Secondary | ICD-10-CM | POA: Diagnosis not present

## 2020-03-30 MED ORDER — PRENATAL VITAMIN 27-0.8 MG PO TABS: 1.0000 | ORAL_TABLET | Freq: Every day | ORAL | 2 refills | Status: DC

## 2020-03-30 MED ORDER — BLOOD PRESSURE MONITOR KIT: 1.0000 | PACK | 0 refills | Status: DC

## 2020-03-30 NOTE — Addendum Note (Signed)
Addended by: Marya Landry D on: 03/30/2020 04:32 PM   Modules accepted: Orders

## 2020-03-30 NOTE — Addendum Note (Signed)
Addended by: Kennon Portela on: 03/30/2020 10:50 AM   Modules accepted: Orders

## 2020-03-30 NOTE — Addendum Note (Signed)
Addended by: Kennon Portela on: 03/30/2020 01:52 PM   Modules accepted: Orders

## 2020-03-30 NOTE — Progress Notes (Signed)
NOB    Miscarriage 11/2019.  Last pap: 10/14/2013 WNL  Genetic Screening: Desires  COVID VACCINE: No  Flu Vaccine: Declines  CC: Notes dizziness at times while working

## 2020-03-30 NOTE — Progress Notes (Signed)
Subjective:  Janet Rodriguez is a 31 y.o. [id number] at [redacted]w[redacted]d being seen today for her first OB visit. EDD by certain LMP. Denies any chronic medical problems or medications. H/O TSVD x 2 without problems.  She is currently monitored for the following issues for this low-risk pregnancy and has Supervision of normal pregnancy, antepartum on their problem list.  Patient reports no complaints.  Contractions: Not present. Vag. Bleeding: None.  Movement: Present. Denies leaking of fluid.   The following portions of the patient's history were reviewed and updated as appropriate: allergies, current medications, past family history, past medical history, past social history, past surgical history and problem list. Problem list updated.  Objective:   Vitals:   03/30/20 0905  BP: 109/68  Pulse: 67  Weight: 124 lb (56.2 kg)    Fetal Status: Fetal Heart Rate (bpm): 168   Movement: Present     General:  Alert, oriented and cooperative. Patient is in no acute distress.  Skin: Skin is warm and dry. No rash noted.   Cardiovascular: Normal heart rate noted  Respiratory: Normal respiratory effort, no problems with respiration noted  Abdomen: Soft, gravid, appropriate for gestational age. Pain/Pressure: Absent     Pelvic:  Cervical exam performed        Extremities: Normal range of motion.  Edema: None  Mental Status: Normal mood and affect. Normal behavior. Normal judgment and thought content.  Breast sym supple, masses, nipple bars noted  Urinalysis:      Assessment and Plan:  Pregnancy: [id number] at [redacted]w[redacted]d  1. Encounter for supervision of normal pregnancy, antepartum, unspecified gravidity Prenatal care and labs reviewed with pt. Genetic testing discussed Declines Flu and Covid vaccine  Preterm labor symptoms and general obstetric precautions including but not limited to vaginal bleeding, contractions, leaking of fluid and fetal movement were reviewed in detail with the patient. Please refer to  After Visit Summary for other counseling recommendations.  Return in about 4 weeks (around 04/27/2020) for face to face, any provider.   Hermina Staggers, MD

## 2020-03-30 NOTE — Patient Instructions (Signed)

## 2020-03-31 LAB — CBC/D/PLT+RPR+RH+ABO+RUB AB...
Antibody Screen: NEGATIVE
Basophils Absolute: 0 10*3/uL (ref 0.0–0.2)
Basos: 1 %
EOS (ABSOLUTE): 0 10*3/uL (ref 0.0–0.4)
Eos: 1 %
HCV Ab: <0.1 s/co ratio (ref 0.0–0.9)
HIV Screen 4th Generation wRfx: NONREACTIVE
Hematocrit: 40.6 % (ref 34.0–46.6)
Hemoglobin: 13.6 g/dL (ref 11.1–15.9)
Hepatitis B Surface Ag: NEGATIVE
Immature Grans (Abs): 0 10*3/uL (ref 0.0–0.1)
Immature Granulocytes: 1 %
Lymphocytes Absolute: 2.2 10*3/uL (ref 0.7–3.1)
Lymphs: 35 %
MCH: 31.1 pg (ref 26.6–33.0)
MCHC: 33.5 g/dL (ref 31.5–35.7)
MCV: 93 fL (ref 79–97)
Monocytes Absolute: 0.4 10*3/uL (ref 0.1–0.9)
Monocytes: 7 %
Neutrophils Absolute: 3.5 10*3/uL (ref 1.4–7.0)
Neutrophils: 55 %
Platelets: 306 10*3/uL (ref 150–450)
RBC: 4.38 x10E6/uL (ref 3.77–5.28)
RDW: 13.1 % (ref 11.7–15.4)
RPR Ser Ql: NONREACTIVE
Rh Factor: POSITIVE
Rubella Antibodies, IGG: 18.3 index (ref >0.99)
WBC: 6.2 10*3/uL (ref 3.4–10.8)

## 2020-03-31 LAB — CERVICOVAGINAL ANCILLARY ONLY
Chlamydia: NEGATIVE
Comment: NEGATIVE
Comment: NORMAL
Neisseria Gonorrhea: NEGATIVE

## 2020-03-31 LAB — HCV INTERPRETATION

## 2020-04-01 LAB — CULTURE, OB URINE

## 2020-04-01 LAB — CYTOLOGY - PAP
Comment: NEGATIVE
Diagnosis: NEGATIVE
High risk HPV: NEGATIVE

## 2020-04-01 LAB — URINE CULTURE, OB REFLEX

## 2020-04-12 ENCOUNTER — Encounter: Payer: Self-pay | Admitting: Obstetrics and Gynecology

## 2020-04-27 ENCOUNTER — Ambulatory Visit (INDEPENDENT_AMBULATORY_CARE_PROVIDER_SITE_OTHER): Payer: Medicaid Other | Admitting: Women's Health

## 2020-04-27 ENCOUNTER — Other Ambulatory Visit: Payer: Self-pay

## 2020-04-27 VITALS — BP 107/66 | HR 77 | Wt 124.0 lb

## 2020-04-27 DIAGNOSIS — Z3490 Encounter for supervision of normal pregnancy, unspecified, unspecified trimester: Secondary | ICD-10-CM

## 2020-04-27 DIAGNOSIS — Z3480 Encounter for supervision of other normal pregnancy, unspecified trimester: Secondary | ICD-10-CM

## 2020-04-27 NOTE — Progress Notes (Signed)
Subjective:  Janet Rodriguez is a 30 y.o. [id number] at [redacted]w[redacted]d being seen today for ongoing prenatal care.  She is currently monitored for the following issues for this low-risk pregnancy and has Supervision of normal pregnancy, antepartum on their problem list.  Patient reports no complaints.  Contractions: Not present. Vag. Bleeding: None.  Movement: Present. Denies leaking of fluid.   The following portions of the patient's history were reviewed and updated as appropriate: allergies, current medications, past family history, past medical history, past social history, past surgical history and problem list. Problem list updated.  Objective:   Vitals:   04/27/20 1343  BP: 107/66  Pulse: 77  Weight: 124 lb (56.2 kg)    Fetal Status: Fetal Heart Rate (bpm): 153   Movement: Present     General:  Alert, oriented and cooperative. Patient is in no acute distress.  Skin: Skin is warm and dry. No rash noted.   Cardiovascular: Normal heart rate noted  Respiratory: Normal respiratory effort, no problems with respiration noted  Abdomen: Soft, gravid, appropriate for gestational age. Pain/Pressure: Absent     Pelvic: Vag. Bleeding: None     Cervical exam deferred        Extremities: Normal range of motion.  Edema: None  Mental Status: Normal mood and affect. Normal behavior. Normal judgment and thought content.   Urinalysis:      Assessment and Plan:  Pregnancy: [id number] at [redacted]w[redacted]d  1. Supervision of other normal pregnancy, antepartum -no concerns -AFP cancelled for today pending dating [place]  2. Pregnancy with uncertain dates, antepartum - CMA unable to get heart tones - FH not c/w 18w dating based on LMP, palpates about 12 weeks - fetal fraction also low on recent genetic testing performed (NIPS), pt wishes to repeat today - fetus appears ~13weeks on [place] today with FHR 153, located low in pelvis - US MFM OB LIMITED; Future  Preterm labor symptoms and general obstetric precautions including  but not limited to vaginal bleeding, contractions, leaking of fluid and fetal movement were reviewed in detail with the patient. I discussed the assessment and treatment plan with the patient. The patient was provided an opportunity to ask questions and all were answered. The patient agreed with the plan and demonstrated an understanding of the instructions. The patient was advised to call back or seek an in-person office evaluation/go to MAU at [facility] for any urgent or concerning symptoms. Please refer to After Visit Summary for other counseling recommendations.  Return in about 4 weeks (around 05/25/2020) for in-person LOB/APP OK, needs dating [place] ASAP - pt has anatomy scan Friday - can type of [place] be changed?Janet Ponto, NP

## 2020-04-27 NOTE — Patient Instructions (Signed)
Maternity Assessment Unit (MAU)  The Maternity Assessment Unit (MAU) is located at the [facility] and [place] [facility]. The address is: [address], [place], [place], [place] [address]. Please see map below for additional directions.    The Maternity Assessment Unit is designed to help you during your pregnancy, and for up to 6 weeks after delivery, with any pregnancy- or postpartum-related emergencies, if you think you are in labor, or if your water has broken. For example, if you experience nausea and vomiting, vaginal bleeding, severe abdominal or pelvic pain, elevated blood pressure or other problems related to your pregnancy or postpartum time, please come to the Maternity Assessment Unit for assistance.        Preterm Labor The normal length of a pregnancy is 39-41 weeks. Preterm labor is when labor starts before 37 completed weeks of pregnancy. Babies who are born prematurely and survive may not be fully developed and may be at an increased risk for long-term problems such as cerebral palsy, developmental delays, and vision and hearing problems. Babies who are born too early may have problems soon after birth. Problems may include regulating blood sugar, body temperature, heart rate, and breathing rate. These babies often have trouble with feeding. The risk of having problems is highest for babies who are born before 44 weeks of pregnancy. What are the causes? The exact cause of this condition is not known. What increases the risk? You are more likely to have preterm labor if you have certain risk factors that relate to your medical history, problems with present and past pregnancies, and lifestyle factors. Medical history  You have abnormalities of the uterus, including a short cervix.  You have STIs (sexually transmitted infections), or other infections of the urinary tract and the vagina.  You have chronic illnesses, such as blood clotting problems,  diabetes, or high blood pressure.  You are overweight or underweight. Present and past pregnancies  You have had preterm labor before.  You are pregnant with twins or other multiples.  You have been diagnosed with a condition in which the placenta covers your cervix (placenta previa).  You waited less than 6 months between giving birth and becoming pregnant again.  Your unborn baby has some abnormalities.  You have vaginal bleeding during pregnancy.  You became pregnant through in vitro fertilization (IVF). Lifestyle and environmental factors  You use tobacco products.  You drink alcohol.  You use street drugs.  You have stress and no social support.  You experience domestic violence.  You are exposed to certain chemicals or environmental pollutants. Other factors  You are younger than age 110 or older than age 19. What are the signs or symptoms? Symptoms of this condition include:  Cramps similar to those that can happen during a menstrual period. The cramps may happen with diarrhea.  Pain in the abdomen or lower back.  Regular contractions that may feel like tightening of the abdomen.  A feeling of increased pressure in the pelvis.  Increased watery or bloody mucus discharge from the vagina.  Water breaking (ruptured amniotic sac). How is this diagnosed? This condition is diagnosed based on:  Your medical history and a physical exam.  A pelvic exam.  An ultrasound.  Monitoring your uterus for contractions.  Other tests, including: ? A swab of the cervix to check for a chemical called fetal fibronectin. ? Urine tests. How is this treated? Treatment for this condition depends on the length of your pregnancy, your  condition, and the health of your baby. Treatment may include:  Taking medicines, such as: ? Hormone medicines. These may be given early in pregnancy to help support the pregnancy. ? Medicines to stop contractions. ? Medicines to help mature  the baby's lungs. These may be prescribed if the risk of delivery is high. ? Medicines to prevent your baby from developing cerebral palsy.  Bed rest. If the labor happens before 34 weeks of pregnancy, you may need to stay in the hospital.  Delivery of the baby. Follow these instructions at home:  Do not use any products that contain nicotine or tobacco, such as cigarettes, e-cigarettes, and chewing tobacco. If you need help quitting, ask your health care provider.  Do not drink alcohol.  Take over-the-counter and prescription medicines only as told by your health care provider.  Rest as told by your health care provider.  Return to your normal activities as told by your health care provider. Ask your health care provider what activities are safe for you.  Keep all follow-up visits as told by your health care provider. This is important.   How is this prevented? To increase your chance of having a full-term pregnancy:  Do not use street drugs or medicines that have not been prescribed to you during your pregnancy.  Talk with your health care provider before taking any herbal supplements, even if you have been taking them regularly.  Make sure you gain a healthy amount of weight during your pregnancy.  Watch for infection. If you think that you might have an infection, get it checked right away. Symptoms of infection may include: ? Fever. ? Abnormal vaginal discharge or discharge that smells bad. ? Pain or burning with urination. ? Needing to urinate urgently. ? Frequently urinating or passing small amounts of urine frequently. ? Blood in your urine. ? Urine that smells bad or unusual.  Tell your health care provider if you have had preterm labor before. Contact a health care provider if:  You think you are going into preterm labor.  You have signs or symptoms of preterm labor.  You have symptoms of infection. Get help right away if:  You are having regular, painful  contractions every 5 minutes or less.  Your water breaks. Summary  Preterm labor is labor that starts before you reach 37 weeks of pregnancy.  Delivering your baby early increases your baby's risk of developing lifelong problems.  The exact cause of preterm labor is unknown. However, having an abnormal uterus, an STI (sexually transmitted infection), or vaginal bleeding during pregnancy increases your risk for preterm labor.  Keep all follow-up visits as told by your health care provider. This is important.  Contact a health care provider if you have signs or symptoms of preterm labor. This information is not intended to replace advice given to you by your health care provider. Make sure you discuss any questions you have with your health care provider. Document Revised: 03/03/2019 Document Reviewed: 03/03/2019 Elsevier Patient Education  2021 [employer].

## 2020-04-27 NOTE — Progress Notes (Signed)
+   Fetal movement. No complaints.  

## 2020-04-28 ENCOUNTER — Ambulatory Visit (INDEPENDENT_AMBULATORY_CARE_PROVIDER_SITE_OTHER): Payer: Medicaid Other

## 2020-04-28 DIAGNOSIS — Z3687 Encounter for antenatal screening for uncertain dates: Secondary | ICD-10-CM

## 2020-04-28 DIAGNOSIS — Z3481 Encounter for supervision of other normal pregnancy, first trimester: Secondary | ICD-10-CM | POA: Diagnosis not present

## 2020-04-28 DIAGNOSIS — Z3480 Encounter for supervision of other normal pregnancy, unspecified trimester: Secondary | ICD-10-CM

## 2020-04-28 NOTE — Progress Notes (Signed)
DATING AND VIABILITY SONOGRAM   Janet Rodriguez is a 30 y.o. year old G5P2022 with LMP Patient's last menstrual period was 12/18/2019 (exact date). which would correlate to  [redacted]w[redacted]d weeks gestation.  She has regular menstrual cycles.   She is here today for a confirmatory initial sonogram.    GESTATION: SINGLETON pregnancy  FETAL ACTIVITY:          Heart rate         163          The fetus is active.  GESTATIONAL AGE AND  BIOMETRICS:  Gestational criteria: Estimated Date of Delivery: 10/29/20 by early ultrasound now at [redacted]w[redacted]d  Previous Scans:0      CROWN RUMP LENGTH           80.8 mm         13 weeks                                                                               AVERAGE EGA(BY THIS SCAN):  13 weeks  WORKING EDD( early ultrasound ):  10/29/20     TECHNICIAN COMMENTS:  Single live IUP at [redacted]w[redacted]d by CRL. FHR 163   A copy of this report including all images has been saved and backed up to a second source for retrieval if needed. All measures and details of the anatomical scan, placentation, fluid volume and pelvic anatomy are contained in that report.  Ogden Handlin J Ludger Bones 04/28/2020 10:13 AM

## 2020-04-28 NOTE — Progress Notes (Signed)
Patient was assessed and managed by nursing staff during this encounter. I have reviewed the chart and agree with the documentation and plan. I have also made any necessary editorial changes.  Catalina Antigua, MD 04/28/2020 2:51 PM

## 2020-04-29 ENCOUNTER — Other Ambulatory Visit: Payer: Medicaid Other

## 2020-05-05 ENCOUNTER — Encounter: Payer: Self-pay | Admitting: Obstetrics and Gynecology

## 2020-05-25 ENCOUNTER — Encounter: Payer: Self-pay | Admitting: Women's Health

## 2020-05-25 ENCOUNTER — Ambulatory Visit (INDEPENDENT_AMBULATORY_CARE_PROVIDER_SITE_OTHER): Payer: Medicaid Other | Admitting: Women's Health

## 2020-05-25 ENCOUNTER — Other Ambulatory Visit: Payer: Self-pay

## 2020-05-25 VITALS — BP 90/59 | HR 67 | Wt 128.0 lb

## 2020-05-25 DIAGNOSIS — Z3480 Encounter for supervision of other normal pregnancy, unspecified trimester: Secondary | ICD-10-CM

## 2020-05-25 DIAGNOSIS — Z3A17 17 weeks gestation of pregnancy: Secondary | ICD-10-CM

## 2020-05-25 NOTE — Patient Instructions (Addendum)
Maternity Assessment Unit (MAU)  The Maternity Assessment Unit (MAU) is located at the [facility] at [facility]. The address is: [address], [place], [place], [place] [address]. Please see map below for additional directions.    The Maternity Assessment Unit is designed to help you during your pregnancy, and for up to 6 weeks after delivery, with any pregnancy- or postpartum-related emergencies, if you think you are in labor, or if your water has broken. For example, if you experience nausea and vomiting, vaginal bleeding, severe abdominal or pelvic pain, elevated blood pressure or other problems related to your pregnancy or postpartum time, please come to the Maternity Assessment Unit for assistance.        Alpha-Fetoprotein Test Why am I having this test? The alpha-fetoprotein test is a lab test most commonly used for pregnant women to help screen for birth defects in their unborn baby. It can be used to screen for chromosome (DNA) abnormalities, problems with the brain or spinal cord, or problems with the abdominal wall of the unborn baby (fetus). The alpha-fetoprotein test may also be done for men or nonpregnant women to check for certain cancers. What is being tested? This test measures the amount of alpha-fetoprotein (AFP) in your blood. AFP is a protein that is made by the liver. Levels can be detected in the mother's blood during pregnancy, starting at 10 weeks and peaking at 16-18 weeks of the pregnancy. Abnormal levels can sometimes be a sign of a birth defect in the baby. Certain cancers can cause a high level of AFP in men and nonpregnant women. What kind of sample is taken? A blood sample is required for this test. It is usually collected by inserting a needle into a blood vessel.   How are the results reported? Your test results will be reported as values. Your health care provider will compare your results to normal ranges that were  established after testing a large group of people (reference values). Reference values may vary among labs and hospitals. For this test, common reference values are:  Adult: Less than 40 ng/mL or less than 40 mcg/L (SI units).  Child younger than 1 year: Less than 30 ng/mL. If you are pregnant, the values may also vary based on how long you have been pregnant. What do the results mean? Results that are above the reference values in pregnant women may indicate the following for the baby:  Neural tube defects, such as abnormalities of the spinal cord or brain.  Abdominal wall defects.  Multiple pregnancy such as twins.  Fetal distress or fetal death. Results that are above the reference values in men or nonpregnant women may indicate:  Reproductive cancers, such as ovarian or testicular cancer.  Liver cancer.  Liver cell death.  Other types of cancer. Very low levels of AFP in pregnant women may indicate Down syndrome for the baby. Talk with your health care provider about what your results mean. Questions to ask your health care provider Ask your health care provider, or the department that is doing the test:  When will my results be ready?  How will I get my results?  What are my treatment options?  What other tests do I need?  What are my next steps? Summary  The alpha-fetoprotein test is done on pregnant women to help screen for birth defects in their unborn baby.  Certain cancers can cause a high level of AFP in men and nonpregnant women.  For  this test, a blood sample is usually collected by inserting a needle into a blood vessel.  Talk with your health care provider about what your results mean. This information is not intended to replace advice given to you by your health care provider. Make sure you discuss any questions you have with your health care provider. Document Revised: 08/21/2019 Document Reviewed: 08/21/2019 Elsevier Patient Education  2021  Elsevier Inc.        Second Trimester of Pregnancy  The second trimester of pregnancy is from week 13 through week 27. This is months 4 through 6 of pregnancy. The second trimester is often a time when you feel your best. Your body has adjusted to being pregnant, and you begin to feel better physically. During the second trimester:  Morning sickness has lessened or stopped completely.  You may have more energy.  You may have an increase in appetite. The second trimester is also a time when the unborn baby (fetus) is growing rapidly. At the end of the sixth month, the fetus may be up to 12 inches long and weigh about 1 pounds. You will likely begin to feel the baby move (quickening) between 16 and 20 weeks of pregnancy. Body changes during your second trimester Your body continues to go through many changes during your second trimester. The changes vary and generally return to normal after the baby is born. Physical changes  Your weight will continue to increase. You will notice your lower abdomen bulging out.  You may begin to get stretch marks on your hips, abdomen, and breasts.  Your breasts will continue to grow and to become tender.  Dark spots or blotches (chloasma or mask of pregnancy) may develop on your face.  A dark line from your belly button to the pubic area (linea nigra) may appear.  You may have changes in your hair. These can include thickening of your hair, rapid growth, and changes in texture. Some people also have hair loss during or after pregnancy, or hair that feels dry or thin. Health changes  You may develop headaches.  You may have heartburn.  You may develop constipation.  You may develop hemorrhoids or swollen, bulging veins (varicose veins).  Your gums may bleed and may be sensitive to brushing and flossing.  You may urinate more often because the fetus is pressing on your bladder.  You may have back pain. This is caused by: ? Weight  gain. ? Pregnancy hormones that are relaxing the joints in your pelvis. ? A shift in weight and the muscles that support your balance. Follow these instructions at home: Medicines  Follow your health care provider's instructions regarding medicine use. Specific medicines may be either safe or unsafe to take during pregnancy. Do not take any medicines unless approved by your health care provider.  Take a prenatal vitamin that contains at least 600 micrograms (mcg) of folic acid. Eating and drinking  Eat a healthy diet that includes fresh fruits and vegetables, whole grains, good sources of protein such as meat, eggs, or tofu, and low-fat dairy products.  Avoid raw meat and unpasteurized juice, milk, and cheese. These carry germs that can harm you and your baby.  You may need to take these actions to prevent or treat constipation: ? Drink enough fluid to keep your urine pale yellow. ? Eat foods that are high in fiber, such as beans, whole grains, and fresh fruits and vegetables. ? Limit foods that are high in fat and processed sugars, such  as fried or sweet foods. Activity  Exercise only as directed by your health care provider. Most people can continue their usual exercise routine during pregnancy. Try to exercise for 30 minutes at least 5 days a week. Stop exercising if you develop contractions in your uterus.  Stop exercising if you develop pain or cramping in the lower abdomen or lower back.  Avoid exercising if it is very hot or humid or if you are at a high altitude.  Avoid heavy lifting.  If you choose to, you may have sex unless your health care provider tells you not to. Relieving pain and discomfort  Wear a supportive bra to prevent discomfort from breast tenderness.  Take warm sitz baths to soothe any pain or discomfort caused by hemorrhoids. Use hemorrhoid cream if your health care provider approves.  Rest with your legs raised (elevated) if you have leg cramps or low  back pain.  If you develop varicose veins: ? Wear support hose as told by your health care provider. ? Elevate your feet for 15 minutes, 3-4 times a day. ? Limit salt in your diet. Safety  Wear your seat belt at all times when driving or riding in a car.  Talk with your health care provider if someone is verbally or physically abusive to you. Lifestyle  Do not use hot tubs, steam rooms, or saunas.  Do not douche. Do not use tampons or scented sanitary pads.  Avoid cat litter boxes and soil used by cats. These carry germs that can cause birth defects in the baby and possibly loss of the fetus by miscarriage or stillbirth.  Do not use herbal remedies, alcohol, illegal drugs, or medicines that are not approved by your health care provider. Chemicals in these products can harm your baby.  Do not use any products that contain nicotine or tobacco, such as cigarettes, e-cigarettes, and chewing tobacco. If you need help quitting, ask your health care provider. General instructions  During a routine prenatal visit, your health care provider will do a physical exam and other tests. He or she will also discuss your overall health. Keep all follow-up visits. This is important.  Ask your health care provider for a referral to a local prenatal education class.  Ask for help if you have counseling or nutritional needs during pregnancy. Your health care provider can offer advice or refer you to specialists for help with various needs. Where to find more information  American Pregnancy Association: americanpregnancy.org  [employer]ians and Gynecologists: https://www.todd-brady.net/  Office on [employer] [facility]: MightyReward.co.nz Contact a health care provider if you have:  A headache that does not go away when you take medicine.  Vision changes or you see spots in front of your eyes.  Mild pelvic cramps, pelvic pressure, or nagging pain in the abdominal  area.  Persistent nausea, vomiting, or diarrhea.  A bad-smelling vaginal discharge or foul-smelling urine.  Pain when you urinate.  Sudden or extreme swelling of your face, hands, ankles, feet, or legs.  A fever. Get help right away if you:  Have fluid leaking from your vagina.  Have spotting or bleeding from your vagina.  Have severe abdominal cramping or pain.  Have difficulty breathing.  Have chest pain.  Have fainting spells.  Have not felt your baby move for the time period told by your health care provider.  Have new or increased pain, swelling, or redness in an arm or leg. Summary  The second trimester of pregnancy is from  week 13 through week 27 (months 4 through 6).  Do not use herbal remedies, alcohol, illegal drugs, or medicines that are not approved by your health care provider. Chemicals in these products can harm your baby.  Exercise only as directed by your health care provider. Most people can continue their usual exercise routine during pregnancy.  Keep all follow-up visits. This is important. This information is not intended to replace advice given to you by your health care provider. Make sure you discuss any questions you have with your health care provider. Document Revised: 07/08/2019 Document Reviewed: 05/14/2019 Elsevier Patient Education  2021 Elsevier Inc.        Round Ligament Pain  The round ligament is a cord of muscle and tissue that helps support the uterus. It can become a source of pain during pregnancy if it becomes stretched or twisted as the baby grows. The pain usually begins in the second trimester (13-28 weeks) of pregnancy, and it can come and go until the baby is delivered. It is not a serious problem, and it does not cause harm to the baby. Round ligament pain is usually a short, sharp, and pinching pain, but it can also be a dull, lingering, and aching pain. The pain is felt in the lower side of the abdomen or in the groin.  It usually starts deep in the groin and moves up to the outside of the hip area. The pain may occur when you:  Suddenly change position, such as quickly going from a sitting to standing position.  Roll over in bed.  Cough or sneeze.  Do physical activity. Follow these instructions at home:  Watch your condition for any changes.  When the pain starts, relax. Then try any of these methods to help with the pain: ? Sitting down. ? Flexing your knees up to your abdomen. ? Lying on your side with one pillow under your abdomen and another pillow between your legs. ? Sitting in a warm bath for 15-20 minutes or until the pain goes away.  Take over-the-counter and prescription medicines only as told by your health care provider.  Move slowly when you sit down or stand up.  Avoid long walks if they cause pain.  Stop or reduce your physical activities if they cause pain.  Keep all follow-up visits as told by your health care provider. This is important.   Contact a health care provider if:  Your pain does not go away with treatment.  You feel pain in your back that you did not have before.  Your medicine is not helping. Get help right away if:  You have a fever or chills.  You develop uterine contractions.  You have vaginal bleeding.  You have nausea or vomiting.  You have diarrhea.  You have pain when you urinate. Summary  Round ligament pain is felt in the lower abdomen or groin. It is usually a short, sharp, and pinching pain. It can also be a dull, lingering, and aching pain.  This pain usually begins in the second trimester (13-28 weeks). It occurs because the uterus is stretching with the growing baby, and it is not harmful to the baby.  You may notice the pain when you suddenly change position, when you cough or sneeze, or during physical activity.  Relaxing, flexing your knees to your abdomen, lying on one side, or taking a warm bath may help to get rid of the  pain.  Get help from your health care provider if  the pain does not go away or if you have vaginal bleeding, nausea, vomiting, diarrhea, or painful urination. This information is not intended to replace advice given to you by your health care provider. Make sure you discuss any questions you have with your health care provider. Document Revised: 07/17/2017 Document Reviewed: 07/17/2017 Elsevier Patient Education  2021 Elsevier Inc.       Childbirth Education Options: [facility] Department Classes:  Childbirth education classes can help you get ready for a positive parenting experience. You can also meet other expectant parents and get free stuff for your baby. Each class runs for five weeks on the same night and costs $45 for the mother-to-be and her support person. Medicaid covers the cost if you are eligible. Call [phone] to register. [facility] Childbirth Education:  [phone] or [phone] or sophia.[email or web address] .com  Baby & Me Class: Discuss newborn & infant parenting and family adjustment issues with other new mothers in a relaxed environment. Each week brings a new speaker or baby-centered activity. We encourage new mothers to join [place] every Thursday at 11:00am. Babies birth until crawling. No registration or fee. Daddy [employer]: This course offers Dads-to-be the tools and knowledge needed to feel confident on their journey to becoming new fathers. Experienced dads, who have been trained as coaches, teach dads-to-be how to hold, comfort, diaper, swaddle and play with their infant while being able to support the new mom as well. A class for men taught by men. $25/dad Big Brother/Big Sister: Let your children share in the joy of a new brother or sister in this special class designed just for them. Class includes discussion about how families care for babies: swaddling, holding, diapering, safety as well as how they can be helpful in their new role. This class is  designed for children ages 2 to 54, but any age is welcome. Please register each child individually. $5/child  Mom Talk: This mom-led group offers support and connection to mothers as they journey through the adjustments and struggles of that sometimes overwhelming first year after the birth of a child. Tuesdays at 10:00am and Thursdays at 6:00pm. Babies welcome. No registration or fee. Breastfeeding Support Group: This group is a mother-to-mother support circle where moms have the opportunity to share their breastfeeding experiences. A Lactation Consultant is present for questions and concerns. Meets each Tuesday at 11:00am. No fee or registration. Breastfeeding Your Baby: Learn what to expect in the first days of breastfeeding your newborn.  This class will help you feel more confident with the skills needed to begin your breastfeeding experience. Many new mothers are concerned about breastfeeding after leaving the hospital. This class will also address the most common fears and challenges about breastfeeding during the first few weeks, months and beyond. (call for fee) Comfort Techniques and Tour: This 2 hour interactive class will provide you the opportunity to learn & practice hands-on techniques that can help relieve some of the discomfort of labor and encourage your baby to rotate toward the best position for birth. You and your partner will be able to try a variety of labor positions with birth balls and rebozos as well as practice breathing, relaxation, and visualization techniques. A tour of the [facility] is included with this class. $20 per registrant and support person Childbirth Class- Weekend Option: This class is a Weekend version of our Birth & Baby series. It is designed for parents who have a difficult time fitting several weeks of classes into their  schedule. It covers the care of your newborn and the basics of labor and childbirth. It also includes a Maternity  Care Center Tour of [facility] and lunch. The class is held two consecutive days: beginning on Friday evening from 6:30 - 8:30 p.m. and the next day, Saturday from 9 a.m. - 4 p.m. (call for fee) Linden Dolin Class: Interested in a waterbirth?  This informational class will help you discover whether waterbirth is the right fit for you. Education about waterbirth itself, supplies you would need and how to assemble your support team is what you can expect from this class. Some obstetrical practices require this class in order to pursue a waterbirth. (Not all obstetrical practices offer waterbirth-check with your healthcare provider.) Register only the expectant mom, but you are encouraged to bring your partner to class! Required if planning waterbirth, no fee. Infant/Child CPR: Parents, grandparents, babysitters, and friends learn Cardio-Pulmonary Resuscitation skills for infants and children. You will also learn how to treat both conscious and unconscious choking in infants and children. This Family & Friends program does not offer certification. Register each participant individually to ensure that enough mannequins are available. (Call for fee) Grandparent Love: Expecting a grandbaby? This class is for you! Learn about the latest infant care and safety recommendations and ways to support your own child as he or she transitions into the parenting role. Taught by Registered Nurses who are childbirth instructors, but most importantly...they are grandmothers too! $10/person. Childbirth Class- Natural Childbirth: This series of 5 weekly classes is for expectant parents who want to learn and practice natural methods of coping with the process of labor and childbirth. Relaxation, breathing, massage, visualization, role of the partner, and helpful positioning are highlighted. Participants learn how to be confident in their body's ability to give birth. This class will empower and help parents make informed decisions  about their own care. Includes discussion that will help new parents transition into the immediate postpartum period. Maternity Care Center Tour of [facility] is included. We suggest taking this class between 25-32 weeks, but it's only a recommendation. $75 per registrant and one support person or $30 Medicaid. Childbirth Class- 3 week Series: This option of 3 weekly classes helps you and your labor partner prepare for childbirth. Newborn care, labor & birth, cesarean birth, pain management, and comfort techniques are discussed and a Maternity Care Center Tour of [facility] is included. The class meets at the same time, on the same day of the week for 3 consecutive weeks beginning with the starting date you choose. $60 for registrant and one support person.  Marvelous Multiples: Expecting twins, triplets, or more? This class covers the differences in labor, birth, parenting, and breastfeeding issues that face multiples' parents. NICU tour is included. Led by a Certified Childbirth Educator who is the mother of twins. No fee. Caring for Baby: This class is for expectant and adoptive parents who want to learn and practice the most up-to-date newborn care for their babies. Focus is on birth through the first six weeks of life. Topics include feeding, bathing, diapering, crying, umbilical cord care, circumcision care and safe sleep. Parents learn to recognize symptoms of illness and when to call the pediatrician. Register only the mom-to-be and your partner or support person can plan to come with you! $10 per registrant and support person Childbirth Class- online option: This online class offers you the freedom to complete a Birth and Baby series in the comfort of your own home. The flexibility of  this option allows you to review sections at your own pace, at times convenient to you and your support people. It includes additional video information, animations, quizzes, and extended activities. Get  organized with helpful eClass tools, checklists, and trackers. Once you register online for the class, you will receive an email within a few days to accept the invitation and begin the class when the time is right for you. The content will be available to you for 60 days. $60 for 60 days of online access for you and your support people.             Preterm Labor The normal length of a pregnancy is 39-41 weeks. Preterm labor is when labor starts before 37 completed weeks of pregnancy. Babies who are born prematurely and survive may not be fully developed and may be at an increased risk for long-term problems such as cerebral palsy, developmental delays, and vision and hearing problems. Babies who are born too early may have problems soon after birth. Problems may include regulating blood sugar, body temperature, heart rate, and breathing rate. These babies often have trouble with feeding. The risk of having problems is highest for babies who are born before 34 weeks of pregnancy. What are the causes? The exact cause of this condition is not known. What increases the risk? You are more likely to have preterm labor if you have certain risk factors that relate to your medical history, problems with present and past pregnancies, and lifestyle factors. Medical history  You have abnormalities of the uterus, including a short cervix.  You have STIs (sexually transmitted infections), or other infections of the urinary tract and the vagina.  You have chronic illnesses, such as blood clotting problems, diabetes, or high blood pressure.  You are overweight or underweight. Present and past pregnancies  You have had preterm labor before.  You are pregnant with twins or other multiples.  You have been diagnosed with a condition in which the placenta covers your cervix (placenta previa).  You waited less than 6 months between giving birth and becoming pregnant again.  Your unborn baby has  some abnormalities.  You have vaginal bleeding during pregnancy.  You became pregnant through in vitro fertilization (IVF). Lifestyle and environmental factors  You use tobacco products.  You drink alcohol.  You use street drugs.  You have stress and no social support.  You experience domestic violence.  You are exposed to certain chemicals or environmental pollutants. Other factors  You are younger than age 84 or older than age 52. What are the signs or symptoms? Symptoms of this condition include:  Cramps similar to those that can happen during a menstrual period. The cramps may happen with diarrhea.  Pain in the abdomen or lower back.  Regular contractions that may feel like tightening of the abdomen.  A feeling of increased pressure in the pelvis.  Increased watery or bloody mucus discharge from the vagina.  Water breaking (ruptured amniotic sac). How is this diagnosed? This condition is diagnosed based on:  Your medical history and a physical exam.  A pelvic exam.  An ultrasound.  Monitoring your uterus for contractions.  Other tests, including: ? A swab of the cervix to check for a chemical called fetal fibronectin. ? Urine tests. How is this treated? Treatment for this condition depends on the length of your pregnancy, your condition, and the health of your baby. Treatment may include:  Taking medicines, such as: ? Hormone medicines. These may be  given early in pregnancy to help support the pregnancy. ? Medicines to stop contractions. ? Medicines to help mature the baby's lungs. These may be prescribed if the risk of delivery is high. ? Medicines to prevent your baby from developing cerebral palsy.  Bed rest. If the labor happens before 34 weeks of pregnancy, you may need to stay in the hospital.  Delivery of the baby. Follow these instructions at home:  Do not use any products that contain nicotine or tobacco, such as cigarettes, e-cigarettes,  and chewing tobacco. If you need help quitting, ask your health care provider.  Do not drink alcohol.  Take over-the-counter and prescription medicines only as told by your health care provider.  Rest as told by your health care provider.  Return to your normal activities as told by your health care provider. Ask your health care provider what activities are safe for you.  Keep all follow-up visits as told by your health care provider. This is important.   How is this prevented? To increase your chance of having a full-term pregnancy:  Do not use street drugs or medicines that have not been prescribed to you during your pregnancy.  Talk with your health care provider before taking any herbal supplements, even if you have been taking them regularly.  Make sure you gain a healthy amount of weight during your pregnancy.  Watch for infection. If you think that you might have an infection, get it checked right away. Symptoms of infection may include: ? Fever. ? Abnormal vaginal discharge or discharge that smells bad. ? Pain or burning with urination. ? Needing to urinate urgently. ? Frequently urinating or passing small amounts of urine frequently. ? Blood in your urine. ? Urine that smells bad or unusual.  Tell your health care provider if you have had preterm labor before. Contact a health care provider if:  You think you are going into preterm labor.  You have signs or symptoms of preterm labor.  You have symptoms of infection. Get help right away if:  You are having regular, painful contractions every 5 minutes or less.  Your water breaks. Summary  Preterm labor is labor that starts before you reach 37 weeks of pregnancy.  Delivering your baby early increases your baby's risk of developing lifelong problems.  The exact cause of preterm labor is unknown. However, having an abnormal uterus, an STI (sexually transmitted infection), or vaginal bleeding during pregnancy  increases your risk for preterm labor.  Keep all follow-up visits as told by your health care provider. This is important.  Contact a health care provider if you have signs or symptoms of preterm labor. This information is not intended to replace advice given to you by your health care provider. Make sure you discuss any questions you have with your health care provider. Document Revised: 03/03/2019 Document Reviewed: 03/03/2019 Elsevier Patient Education  2021 [employer].

## 2020-05-25 NOTE — Progress Notes (Signed)
Subjective:  Janet Rodriguez is a 30 y.o. [id number] at [redacted]w[redacted]d being seen today for ongoing prenatal care.  She is currently monitored for the following issues for this low-risk pregnancy and has Supervision of normal pregnancy, antepartum on their problem list.  Patient reports no complaints.  Contractions: Not present. Vag. Bleeding: None.  Movement: Present. Denies leaking of fluid.   The following portions of the patient's history were reviewed and updated as appropriate: allergies, current medications, past family history, past medical history, past social history, past surgical history and problem list. Problem list updated.  Objective:   Vitals:   05/25/20 1033  BP: (!) 90/59  Pulse: 67  Weight: 128 lb (58.1 kg)    Fetal Status: Fetal Heart Rate (bpm): 154   Movement: Present     General:  Alert, oriented and cooperative. Patient is in no acute distress.  Skin: Skin is warm and dry. No rash noted.   Cardiovascular: Normal heart rate noted  Respiratory: Normal respiratory effort, no problems with respiration noted  Abdomen: Soft, gravid, appropriate for gestational age. Pain/Pressure: Absent     Pelvic: Vag. Bleeding: None     Cervical exam deferred        Extremities: Normal range of motion.  Edema: None  Mental Status: Normal mood and affect. Normal behavior. Normal judgment and thought content.   Urinalysis:      Assessment and Plan:  Pregnancy: [id number] at [redacted]w[redacted]d  1. Supervision of other normal pregnancy, antepartum -AFP today -CBE info given  2. [redacted] weeks gestation of pregnancy  Preterm labor symptoms and general obstetric precautions including but not limited to vaginal bleeding, contractions, leaking of fluid and fetal movement were reviewed in detail with the patient. Please refer to After Visit Summary for other counseling recommendations.  Return in about 4 weeks (around 06/22/2020) for in-person LOB/APP OK.   Makoto Sellitto, Odie Sera, NP

## 2020-05-27 LAB — AFP, SERUM, OPEN SPINA BIFIDA
AFP MoM: 1.36
AFP Value: 69.8 ng/mL
Gest. Age on Collection Date: 17.4 weeks
Maternal Age At EDD: 32.3 yr
OSBR Risk 1 IN: 8068
Test Results:: NEGATIVE
Weight: 128 [lb_av]

## 2020-06-06 ENCOUNTER — Other Ambulatory Visit: Payer: Self-pay

## 2020-06-06 ENCOUNTER — Other Ambulatory Visit: Payer: Self-pay | Admitting: Obstetrics and Gynecology

## 2020-06-06 ENCOUNTER — Ambulatory Visit: Payer: Medicaid Other | Attending: Obstetrics and Gynecology

## 2020-06-06 ENCOUNTER — Ambulatory Visit: Payer: Medicaid Other

## 2020-06-06 DIAGNOSIS — Z3490 Encounter for supervision of normal pregnancy, unspecified, unspecified trimester: Secondary | ICD-10-CM | POA: Diagnosis not present

## 2020-06-22 ENCOUNTER — Other Ambulatory Visit: Payer: Self-pay

## 2020-06-22 ENCOUNTER — Ambulatory Visit (INDEPENDENT_AMBULATORY_CARE_PROVIDER_SITE_OTHER): Payer: Medicaid Other | Admitting: Women's Health

## 2020-06-22 VITALS — BP 99/57 | HR 72 | Wt 134.6 lb

## 2020-06-22 DIAGNOSIS — Z3480 Encounter for supervision of other normal pregnancy, unspecified trimester: Secondary | ICD-10-CM

## 2020-06-22 DIAGNOSIS — Z3A21 21 weeks gestation of pregnancy: Secondary | ICD-10-CM

## 2020-06-22 NOTE — Progress Notes (Signed)
Pt reports fetal movement, denies pain.  

## 2020-06-22 NOTE — Progress Notes (Signed)
Subjective:  Janet Rodriguez is a 30 y.o. [id number] at [redacted]w[redacted]d being seen today for ongoing prenatal care.  She is currently monitored for the following issues for this low-risk pregnancy and has Supervision of normal pregnancy, antepartum on their problem list.  Patient reports no complaints.  Contractions: Not present. Vag. Bleeding: None.  Movement: Present. Denies leaking of fluid.   The following portions of the patient's history were reviewed and updated as appropriate: allergies, current medications, past family history, past medical history, past social history, past surgical history and problem list. Problem list updated.  Objective:   Vitals:   06/22/20 1105  BP: (!) 99/57  Pulse: 72  Weight: 134 lb 9.6 oz (61.1 kg)    Fetal Status: Fetal Heart Rate (bpm): 155   Movement: Present     General:  Alert, oriented and cooperative. Patient is in no acute distress.  Skin: Skin is warm and dry. No rash noted.   Cardiovascular: Normal heart rate noted  Respiratory: Normal respiratory effort, no problems with respiration noted  Abdomen: Soft, gravid, appropriate for gestational age. Pain/Pressure: Absent     Pelvic: Vag. Bleeding: None     Cervical exam deferred        Extremities: Normal range of motion.  Edema: None  Mental Status: Normal mood and affect. Normal behavior. Normal judgment and thought content.   Urinalysis:      Assessment and Plan:  Pregnancy: [id number] at [redacted]w[redacted]d  1. Supervision of other normal pregnancy, antepartum -CBE info given -GTT/labs next visit  2. [redacted] weeks gestation of pregnancy  Preterm labor symptoms and general obstetric precautions including but not limited to vaginal bleeding, contractions, leaking of fluid and fetal movement were reviewed in detail with the patient. I discussed the assessment and treatment plan with the patient. The patient was provided an opportunity to ask questions and all were answered. The patient agreed with the plan and  demonstrated an understanding of the instructions. The patient was advised to call back or seek an in-person office evaluation/go to [facility] at [facility] for any urgent or concerning symptoms. Please refer to After Visit Summary for other counseling recommendations.  Return in about 4 weeks (around 07/20/2020) for in-person LOB/APP OK/GTT/labs.   Khamya Topp, Odie Sera, NP

## 2020-06-22 NOTE — Patient Instructions (Addendum)
Maternity Assessment Unit (MAU)  The Maternity Assessment Unit (MAU) is located at the [facility] and [place] [facility]. The address is: [address], [place], [place], [place] [address]. Please see map below for additional directions.    The Maternity Assessment Unit is designed to help you during your pregnancy, and for up to 6 weeks after delivery, with any pregnancy- or postpartum-related emergencies, if you think you are in labor, or if your water has broken. For example, if you experience nausea and vomiting, vaginal bleeding, severe abdominal or pelvic pain, elevated blood pressure or other problems related to your pregnancy or postpartum time, please come to the Maternity Assessment Unit for assistance.        Preterm Labor The normal length of a pregnancy is 39-41 weeks. Preterm labor is when labor starts before 37 completed weeks of pregnancy. Babies who are born prematurely and survive may not be fully developed and may be at an increased risk for long-term problems such as cerebral palsy, developmental delays, and vision and hearing problems. Babies who are born too early may have problems soon after birth. Problems may include regulating blood sugar, body temperature, heart rate, and breathing rate. These babies often have trouble with feeding. The risk of having problems is highest for babies who are born before 44 weeks of pregnancy. What are the causes? The exact cause of this condition is not known. What increases the risk? You are more likely to have preterm labor if you have certain risk factors that relate to your medical history, problems with present and past pregnancies, and lifestyle factors. Medical history  You have abnormalities of the uterus, including a short cervix.  You have STIs (sexually transmitted infections), or other infections of the urinary tract and the vagina.  You have chronic illnesses, such as blood clotting problems,  diabetes, or high blood pressure.  You are overweight or underweight. Present and past pregnancies  You have had preterm labor before.  You are pregnant with twins or other multiples.  You have been diagnosed with a condition in which the placenta covers your cervix (placenta previa).  You waited less than 6 months between giving birth and becoming pregnant again.  Your unborn baby has some abnormalities.  You have vaginal bleeding during pregnancy.  You became pregnant through in vitro fertilization (IVF). Lifestyle and environmental factors  You use tobacco products.  You drink alcohol.  You use street drugs.  You have stress and no social support.  You experience domestic violence.  You are exposed to certain chemicals or environmental pollutants. Other factors  You are younger than age 110 or older than age 19. What are the signs or symptoms? Symptoms of this condition include:  Cramps similar to those that can happen during a menstrual period. The cramps may happen with diarrhea.  Pain in the abdomen or lower back.  Regular contractions that may feel like tightening of the abdomen.  A feeling of increased pressure in the pelvis.  Increased watery or bloody mucus discharge from the vagina.  Water breaking (ruptured amniotic sac). How is this diagnosed? This condition is diagnosed based on:  Your medical history and a physical exam.  A pelvic exam.  An ultrasound.  Monitoring your uterus for contractions.  Other tests, including: ? A swab of the cervix to check for a chemical called fetal fibronectin. ? Urine tests. How is this treated? Treatment for this condition depends on the length of your pregnancy, your  condition, and the health of your baby. Treatment may include:  Taking medicines, such as: ? Hormone medicines. These may be given early in pregnancy to help support the pregnancy. ? Medicines to stop contractions. ? Medicines to help mature  the baby's lungs. These may be prescribed if the risk of delivery is high. ? Medicines to prevent your baby from developing cerebral palsy.  Bed rest. If the labor happens before 34 weeks of pregnancy, you may need to stay in the hospital.  Delivery of the baby. Follow these instructions at home:  Do not use any products that contain nicotine or tobacco, such as cigarettes, e-cigarettes, and chewing tobacco. If you need help quitting, ask your health care provider.  Do not drink alcohol.  Take over-the-counter and prescription medicines only as told by your health care provider.  Rest as told by your health care provider.  Return to your normal activities as told by your health care provider. Ask your health care provider what activities are safe for you.  Keep all follow-up visits as told by your health care provider. This is important.   How is this prevented? To increase your chance of having a full-term pregnancy:  Do not use street drugs or medicines that have not been prescribed to you during your pregnancy.  Talk with your health care provider before taking any herbal supplements, even if you have been taking them regularly.  Make sure you gain a healthy amount of weight during your pregnancy.  Watch for infection. If you think that you might have an infection, get it checked right away. Symptoms of infection may include: ? Fever. ? Abnormal vaginal discharge or discharge that smells bad. ? Pain or burning with urination. ? Needing to urinate urgently. ? Frequently urinating or passing small amounts of urine frequently. ? Blood in your urine. ? Urine that smells bad or unusual.  Tell your health care provider if you have had preterm labor before. Contact a health care provider if:  You think you are going into preterm labor.  You have signs or symptoms of preterm labor.  You have symptoms of infection. Get help right away if:  You are having regular, painful  contractions every 5 minutes or less.  Your water breaks. Summary  Preterm labor is labor that starts before you reach 37 weeks of pregnancy.  Delivering your baby early increases your baby's risk of developing lifelong problems.  The exact cause of preterm labor is unknown. However, having an abnormal uterus, an STI (sexually transmitted infection), or vaginal bleeding during pregnancy increases your risk for preterm labor.  Keep all follow-up visits as told by your health care provider. This is important.  Contact a health care provider if you have signs or symptoms of preterm labor. This information is not intended to replace advice given to you by your health care provider. Make sure you discuss any questions you have with your health care provider. Document Revised: 03/03/2019 Document Reviewed: 03/03/2019 Elsevier Patient Education  2021 Elsevier Inc.       Childbirth Education Options: [facility] Department Classes:  Childbirth education classes can help you get ready for a positive parenting experience. You can also meet other expectant parents and get free stuff for your baby. Each class runs for five weeks on the same night and costs $45 for the mother-to-be and her support person. Medicaid covers the cost if you are eligible. Call [phone] to register. [facility] Childbirth Education:  [phone] or [phone]  or [email or web address]  Baby & Me Class: Discuss newborn & infant parenting and family adjustment issues with other new mothers in a relaxed environment. Each week brings a new speaker or baby-centered activity. We encourage new mothers to join [place] every Thursday at 11:00am. Babies birth until crawling. No registration or fee. Daddy [employer]: This course offers Dads-to-be the tools and knowledge needed to feel confident on their journey to becoming new fathers. Experienced dads, who have been trained as coaches, teach dads-to-be how to  hold, comfort, diaper, swaddle and play with their infant while being able to support the new mom as well. A class for men taught by men. $25/dad Big Brother/Big Sister: Let your children share in the joy of a new brother or sister in this special class designed just for them. Class includes discussion about how families care for babies: swaddling, holding, diapering, safety as well as how they can be helpful in their new role. This class is designed for children ages 2 to 55, but any age is welcome. Please register each child individually. $5/child  Mom Talk: This mom-led group offers support and connection to mothers as they journey through the adjustments and struggles of that sometimes overwhelming first year after the birth of a child. Tuesdays at 10:00am and Thursdays at 6:00pm. Babies welcome. No registration or fee. Breastfeeding Support Group: This group is a mother-to-mother support circle where moms have the opportunity to share their breastfeeding experiences. A Lactation Consultant is present for questions and concerns. Meets each Tuesday at 11:00am. No fee or registration. Breastfeeding Your Baby: Learn what to expect in the first days of breastfeeding your newborn.  This class will help you feel more confident with the skills needed to begin your breastfeeding experience. Many new mothers are concerned about breastfeeding after leaving the hospital. This class will also address the most common fears and challenges about breastfeeding during the first few weeks, months and beyond. (call for fee) Comfort Techniques and Tour: This 2 hour interactive class will provide you the opportunity to learn & practice hands-on techniques that can help relieve some of the discomfort of labor and encourage your baby to rotate toward the best position for birth. You and your partner will be able to try a variety of labor positions with birth balls and rebozos as well as practice breathing, relaxation, and  visualization techniques. A tour of the [facility] is included with this class. $20 per registrant and support person Childbirth Class- Weekend Option: This class is a Weekend version of our Birth & Baby series. It is designed for parents who have a difficult time fitting several weeks of classes into their schedule. It covers the care of your newborn and the basics of labor and childbirth. It also includes a Maternity Care Center Tour of [facility] and lunch. The class is held two consecutive days: beginning on Friday evening from 6:30 - 8:30 p.m. and the next day, Saturday from 9 a.m. - 4 p.m. (call for fee) Linden Dolin Class: Interested in a waterbirth?  This informational class will help you discover whether waterbirth is the right fit for you. Education about waterbirth itself, supplies you would need and how to assemble your support team is what you can expect from this class. Some obstetrical practices require this class in order to pursue a waterbirth. (Not all obstetrical practices offer waterbirth-check with your healthcare provider.) Register only the expectant mom, but you are encouraged to bring your partner to class! Required  if planning waterbirth, no fee. Infant/Child CPR: Parents, grandparents, babysitters, and friends learn Cardio-Pulmonary Resuscitation skills for infants and children. You will also learn how to treat both conscious and unconscious choking in infants and children. This Family & Friends program does not offer certification. Register each participant individually to ensure that enough mannequins are available. (Call for fee) Grandparent Love: Expecting a grandbaby? This class is for you! Learn about the latest infant care and safety recommendations and ways to support your own child as he or she transitions into the parenting role. Taught by Registered Nurses who are childbirth instructors, but most importantly...they are grandmothers too!  $10/person. Childbirth Class- Natural Childbirth: This series of 5 weekly classes is for expectant parents who want to learn and practice natural methods of coping with the process of labor and childbirth. Relaxation, breathing, massage, visualization, role of the partner, and helpful positioning are highlighted. Participants learn how to be confident in their body's ability to give birth. This class will empower and help parents make informed decisions about their own care. Includes discussion that will help new parents transition into the immediate postpartum period. Maternity Care Center Tour of [facility] is included. We suggest taking this class between 25-32 weeks, but it's only a recommendation. $75 per registrant and one support person or $30 Medicaid. Childbirth Class- 3 week Series: This option of 3 weekly classes helps you and your labor partner prepare for childbirth. Newborn care, labor & birth, cesarean birth, pain management, and comfort techniques are discussed and a Maternity Care Center Tour of [facility] is included. The class meets at the same time, on the same day of the week for 3 consecutive weeks beginning with the starting date you choose. $60 for registrant and one support person.  Marvelous Multiples: Expecting twins, triplets, or more? This class covers the differences in labor, birth, parenting, and breastfeeding issues that face multiples' parents. NICU tour is included. Led by a Certified Childbirth Educator who is the mother of twins. No fee. Caring for Baby: This class is for expectant and adoptive parents who want to learn and practice the most up-to-date newborn care for their babies. Focus is on birth through the first six weeks of life. Topics include feeding, bathing, diapering, crying, umbilical cord care, circumcision care and safe sleep. Parents learn to recognize symptoms of illness and when to call the pediatrician. Register only the mom-to-be and your  partner or support person can plan to come with you! $10 per registrant and support person Childbirth Class- online option: This online class offers you the freedom to complete a Birth and Baby series in the comfort of your own home. The flexibility of this option allows you to review sections at your own pace, at times convenient to you and your support people. It includes additional video information, animations, quizzes, and extended activities. Get organized with helpful eClass tools, checklists, and trackers. Once you register online for the class, you will receive an email within a few days to accept the invitation and begin the class when the time is right for you. The content will be available to you for 60 days. $60 for 60 days of online access for you and your support people.               Oral Glucose Tolerance Test During Pregnancy Why am I having this test? The oral glucose tolerance test (OGTT) is done to check how your body processes blood sugar (glucose). This is one of several  tests used to diagnose diabetes that develops during pregnancy (gestational diabetes mellitus). Gestational diabetes is a short-term form of diabetes that some women develop while they are pregnant. It usually occurs during the second trimester of pregnancy and goes away after delivery. Testing, or screening, for gestational diabetes usually occurs at weeks 24-28 of pregnancy. You may have the OGTT test after having a 1-hour glucose screening test if the results from that test indicate that you may have gestational diabetes. This test may also be needed if:  You have a history of gestational diabetes.  There is a history of giving birth to very large babies or of losing pregnancies (having stillbirths).  You have signs and symptoms of diabetes, such as: ? Changes in your eyesight. ? Tingling or numbness in your hands or feet. ? Changes in hunger, thirst, and urination, and these are not explained by  your pregnancy. What is being tested? This test measures the amount of glucose in your blood at different times during a period of 3 hours. This shows how well your body can process glucose. What kind of sample is taken? Blood samples are required for this test. They are usually collected by inserting a needle into a blood vessel.   How do I prepare for this test?  For 3 days before your test, eat normally. Have plenty of carbohydrate-rich foods.  Follow instructions from your health care provider about: ? Eating or drinking restrictions on the day of the test. You may be asked not to eat or drink anything other than water (to fast) starting 8-10 hours before the test. ? Changing or stopping your regular medicines. Some medicines may interfere with this test. Tell a health care provider about:  All medicines you are taking, including vitamins, herbs, eye drops, creams, and over-the-counter medicines.  Any blood disorders you have.  Any surgeries you have had.  Any medical conditions you have. What happens during the test? First, your blood glucose will be measured. This is referred to as your fasting blood glucose because you fasted before the test. Then, you will drink a glucose solution that contains a certain amount of glucose. Your blood glucose will be measured again 1, 2, and 3 hours after you drink the solution. This test takes about 3 hours to complete. You will need to stay at the testing location during this time. During the testing period:  Do not eat or drink anything other than the glucose solution.  Do not exercise.  Do not use any products that contain nicotine or tobacco, such as cigarettes, e-cigarettes, and chewing tobacco. These can affect your test results. If you need help quitting, ask your health care provider. The testing procedure may vary among health care providers and hospitals. How are the results reported? Your results will be reported as milligrams of  glucose per deciliter of blood (mg/dL) or millimoles per liter (mmol/L). There is more than one source for screening and diagnosis reference values used to diagnose gestational diabetes. Your health care provider will compare your results to normal values that were established after testing a large group of people (reference values). Reference values may vary among labs and hospitals. For this test (Carpenter-Coustan), reference values are:  Fasting: 95 mg/dL (5.3 mmol/L).  1 hour: 180 mg/dL (16.110.0 mmol/L).  2 hour: 155 mg/dL (8.6 mmol/L).  3 hour: 140 mg/dL (7.8 mmol/L). What do the results mean? Results below the reference values are considered normal. If two or more of your blood glucose levels  are at or above the reference values, you may be diagnosed with gestational diabetes. If only one level is high, your health care provider may suggest repeat testing or other tests to confirm a diagnosis. Talk with your health care provider about what your results mean. Questions to ask your health care provider Ask your health care provider, or the department that is doing the test:  When will my results be ready?  How will I get my results?  What are my treatment options?  What other tests do I need?  What are my next steps? Summary  The oral glucose tolerance test (OGTT) is one of several tests used to diagnose diabetes that develops during pregnancy (gestational diabetes mellitus). Gestational diabetes is a short-term form of diabetes that some women develop while they are pregnant.  You may have the OGTT test after having a 1-hour glucose screening test if the results from that test show that you may have gestational diabetes. You may also have this test if you have any symptoms or risk factors for this type of diabetes.  Talk with your health care provider about what your results mean. This information is not intended to replace advice given to you by your health care provider. Make sure  you discuss any questions you have with your health care provider. Document Revised: 07/09/2019 Document Reviewed: 07/09/2019 Elsevier Patient Education  2021 [employer].

## 2020-07-20 ENCOUNTER — Ambulatory Visit (INDEPENDENT_AMBULATORY_CARE_PROVIDER_SITE_OTHER): Payer: Medicaid Other | Admitting: Family Medicine

## 2020-07-20 ENCOUNTER — Other Ambulatory Visit: Payer: Medicaid Other

## 2020-07-20 ENCOUNTER — Encounter: Payer: Self-pay | Admitting: Family Medicine

## 2020-07-20 ENCOUNTER — Other Ambulatory Visit: Payer: Self-pay | Admitting: Obstetrics

## 2020-07-20 ENCOUNTER — Other Ambulatory Visit: Payer: Self-pay

## 2020-07-20 VITALS — BP 99/66 | HR 77 | Wt 137.0 lb

## 2020-07-20 DIAGNOSIS — Z3480 Encounter for supervision of other normal pregnancy, unspecified trimester: Secondary | ICD-10-CM

## 2020-07-20 NOTE — Progress Notes (Signed)
Review of pt screening questionnaire, W&SAS screen was positive with score of 26.  Reviewed case with Shanda Bumps, SW and she will follow up with pt regarding any needs.

## 2020-07-20 NOTE — Progress Notes (Signed)
   Subjective:  Janet Rodriguez is a 30 y.o. [phone] at [redacted]w[redacted]d being seen today for ongoing prenatal care.  She is currently monitored for the following issues for this low-risk pregnancy and has Supervision of normal pregnancy, antepartum on their problem list.  Patient reports no complaints.  Contractions: Not present. Vag. Bleeding: None.  Movement: Present. Denies leaking of fluid.   The following portions of the patient's history were reviewed and updated as appropriate: allergies, current medications, past family history, past medical history, past social history, past surgical history and problem list. Problem list updated.  Objective:   Vitals:   07/20/20 0941  BP: 99/66  Pulse: 77  Weight: 137 lb (62.1 kg)    Fetal Status: Fetal Heart Rate (bpm): 150 Fundal Height: 25 cm Movement: Present     General:  Alert, oriented and cooperative. Patient is in no acute distress.  Skin: Skin is warm and dry. No rash noted.   Cardiovascular: Normal heart rate noted  Respiratory: Normal respiratory effort, no problems with respiration noted  Abdomen: Soft, gravid, appropriate for gestational age. Pain/Pressure: Absent     Pelvic: Vag. Bleeding: None     Cervical exam deferred        Extremities: Normal range of motion.     Mental Status: Normal mood and affect. Normal behavior. Normal judgment and thought content.   Urinalysis:      Assessment and Plan:  Pregnancy: [id number] at [redacted]w[redacted]d  1. Supervision of other normal pregnancy, antepartum BP and FHR normal FH appropriate Third tri labs today as patient is already fasting TDaP next visit  Preterm labor symptoms and general obstetric precautions including but not limited to vaginal bleeding, contractions, leaking of fluid and fetal movement were reviewed in detail with the patient. Please refer to After Visit Summary for other counseling recommendations.  Return in 2 weeks (on 08/03/2020) for ob visit, [facility].   Venora Maples,  MD

## 2020-07-20 NOTE — Patient Instructions (Signed)
 Contraception Choices Contraception, also called birth control, refers to methods or devices that prevent pregnancy. Hormonal methods Contraceptive implant A contraceptive implant is a thin, plastic tube that contains a hormone that prevents pregnancy. It is different from an intrauterine device (IUD). It is inserted into the upper part of the arm by a health care provider. Implants can be effective for up to 3 years. Progestin-only injections Progestin-only injections are injections of progestin, a synthetic form of the hormone progesterone. They are given every 3 months by a health care provider. Birth control pills Birth control pills are pills that contain hormones that prevent pregnancy. They must be taken once a day, preferably at the same time each day. A prescription is needed to use this method of contraception. Birth control patch The birth control patch contains hormones that prevent pregnancy. It is placed on the skin and must be changed once a week for three weeks and removed on the fourth week. A prescription is needed to use this method of contraception. Vaginal ring A vaginal ring contains hormones that prevent pregnancy. It is placed in the vagina for three weeks and removed on the fourth week. After that, the process is repeated with a new ring. A prescription is needed to use this method of contraception. Emergency contraceptive Emergency contraceptives prevent pregnancy after unprotected sex. They come in pill form and can be taken up to 5 days after sex. They work best the sooner they are taken after having sex. Most emergency contraceptives are available without a prescription. This method should not be used as your only form of birth control.   Barrier methods Female condom A female condom is a thin sheath that is worn over the penis during sex. Condoms keep sperm from going inside a woman's body. They can be used with a sperm-killing substance (spermicide) to increase their  effectiveness. They should be thrown away after one use. Female condom A female condom is a soft, loose-fitting sheath that is put into the vagina before sex. The condom keeps sperm from going inside a woman's body. They should be thrown away after one use. Diaphragm A diaphragm is a soft, dome-shaped barrier. It is inserted into the vagina before sex, along with a spermicide. The diaphragm blocks sperm from entering the uterus, and the spermicide kills sperm. A diaphragm should be left in the vagina for 6-8 hours after sex and removed within 24 hours. A diaphragm is prescribed and fitted by a health care provider. A diaphragm should be replaced every 1-2 years, after giving birth, after gaining more than 15 lb (6.8 kg), and after pelvic surgery. Cervical cap A cervical cap is a round, soft latex or plastic cup that fits over the cervix. It is inserted into the vagina before sex, along with spermicide. It blocks sperm from entering the uterus. The cap should be left in place for 6-8 hours after sex and removed within 48 hours. A cervical cap must be prescribed and fitted by a health care provider. It should be replaced every 2 years. Sponge A sponge is a soft, circular piece of polyurethane foam with spermicide in it. The sponge helps block sperm from entering the uterus, and the spermicide kills sperm. To use it, you make it wet and then insert it into the vagina. It should be inserted before sex, left in for at least 6 hours after sex, and removed and thrown away within 30 hours. Spermicides Spermicides are chemicals that kill or block sperm from entering the   cervix and uterus. They can come as a cream, jelly, suppository, foam, or tablet. A spermicide should be inserted into the vagina with an applicator at least 10-15 minutes before sex to allow time for it to work. The process must be repeated every time you have sex. Spermicides do not require a prescription.   Intrauterine  contraception Intrauterine device (IUD) An IUD is a T-shaped device that is put in a woman's uterus. There are two types:  Hormone IUD.This type contains progestin, a synthetic form of the hormone progesterone. This type can stay in place for 3-5 years.  Copper IUD.This type is wrapped in copper wire. It can stay in place for 10 years. Permanent methods of contraception Female tubal ligation In this method, a woman's fallopian tubes are sealed, tied, or blocked during surgery to prevent eggs from traveling to the uterus. Hysteroscopic sterilization In this method, a small, flexible insert is placed into each fallopian tube. The inserts cause scar tissue to form in the fallopian tubes and block them, so sperm cannot reach an egg. The procedure takes about 3 months to be effective. Another form of birth control must be used during those 3 months. Female sterilization This is a procedure to tie off the tubes that carry sperm (vasectomy). After the procedure, the man can still ejaculate fluid (semen). Another form of birth control must be used for 3 months after the procedure. Natural planning methods Natural family planning In this method, a couple does not have sex on days when the woman could become pregnant. Calendar method In this method, the woman keeps track of the length of each menstrual cycle, identifies the days when pregnancy can happen, and does not have sex on those days. Ovulation method In this method, a couple avoids sex during ovulation. Symptothermal method This method involves not having sex during ovulation. The woman typically checks for ovulation by watching changes in her temperature and in the consistency of cervical mucus. Post-ovulation method In this method, a couple waits to have sex until after ovulation. Where to find more information  Centers for Disease Control and Prevention: www.cdc.gov Summary  Contraception, also called birth control, refers to methods or  devices that prevent pregnancy.  Hormonal methods of contraception include implants, injections, pills, patches, vaginal rings, and emergency contraceptives.  Barrier methods of contraception can include female condoms, female condoms, diaphragms, cervical caps, sponges, and spermicides.  There are two types of IUDs (intrauterine devices). An IUD can be put in a woman's uterus to prevent pregnancy for 3-5 years.  Permanent sterilization can be done through a procedure for males and females. Natural family planning methods involve nothaving sex on days when the woman could become pregnant. This information is not intended to replace advice given to you by your health care provider. Make sure you discuss any questions you have with your health care provider. Document Revised: 07/06/2019 Document Reviewed: 07/06/2019 Elsevier Patient Education  2021 [employer]   Breastfeeding  Choosing to breastfeed is one of the best decisions you can make for yourself and your baby. A change in hormones during pregnancy causes your breasts to make breast milk in your milk-producing glands. Hormones prevent breast milk from being released before your baby is born. They also prompt milk flow after birth. Once breastfeeding has begun, thoughts of your baby, as well as his or her sucking or crying, can stimulate the release of milk from your milk-producing glands. Benefits of breastfeeding Research shows that breastfeeding offers many health benefits   for infants and mothers. It also offers a cost-free and convenient way to feed your baby. For your baby  Your first milk (colostrum) helps your baby's digestive system to function better.  Special cells in your milk (antibodies) help your baby to fight off infections.  Breastfed babies are less likely to develop asthma, allergies, obesity, or type 2 diabetes. They are also at lower risk for sudden infant death syndrome (SIDS).  Nutrients in breast milk are better  able to meet your baby's needs compared to infant formula.  Breast milk improves your baby's brain development. For you  Breastfeeding helps to create a very special bond between you and your baby.  Breastfeeding is convenient. Breast milk costs nothing and is always available at the correct temperature.  Breastfeeding helps to burn calories. It helps you to lose the weight that you gained during pregnancy.  Breastfeeding makes your uterus return faster to its size before pregnancy. It also slows bleeding (lochia) after you give birth.  Breastfeeding helps to lower your risk of developing type 2 diabetes, osteoporosis, rheumatoid arthritis, cardiovascular disease, and breast, ovarian, uterine, and endometrial cancer later in life. Breastfeeding basics Starting breastfeeding  Find a comfortable place to sit or lie down, with your neck and back well-supported.  Place a pillow or a rolled-up blanket under your baby to bring him or her to the level of your breast (if you are seated). Nursing pillows are specially designed to help support your arms and your baby while you breastfeed.  Make sure that your baby's tummy (abdomen) is facing your abdomen.  Gently massage your breast. With your fingertips, massage from the outer edges of your breast inward toward the nipple. This encourages milk flow. If your milk flows slowly, you may need to continue this action during the feeding.  Support your breast with 4 fingers underneath and your thumb above your nipple (make the letter "C" with your hand). Make sure your fingers are well away from your nipple and your baby's mouth.  Stroke your baby's lips gently with your finger or nipple.  When your baby's mouth is open wide enough, quickly bring your baby to your breast, placing your entire nipple and as much of the areola as possible into your baby's mouth. The areola is the colored area around your nipple. ? More areola should be visible above your  baby's upper lip than below the lower lip. ? Your baby's lips should be opened and extended outward (flanged) to ensure an adequate, comfortable latch. ? Your baby's tongue should be between his or her lower gum and your breast.  Make sure that your baby's mouth is correctly positioned around your nipple (latched). Your baby's lips should create a seal on your breast and be turned out (everted).  It is common for your baby to suck about 2-3 minutes in order to start the flow of breast milk. Latching Teaching your baby how to latch onto your breast properly is very important. An improper latch can cause nipple pain, decreased milk supply, and poor weight gain in your baby. Also, if your baby is not latched onto your nipple properly, he or she may swallow some air during feeding. This can make your baby fussy. Burping your baby when you switch breasts during the feeding can help to get rid of the air. However, teaching your baby to latch on properly is still the best way to prevent fussiness from swallowing air while breastfeeding. Signs that your baby has successfully latched onto   your nipple  Silent tugging or silent sucking, without causing you pain. Infant's lips should be extended outward (flanged).  Swallowing heard between every 3-4 sucks once your milk has started to flow (after your let-down milk reflex occurs).  Muscle movement above and in front of his or her ears while sucking. Signs that your baby has not successfully latched onto your nipple  Sucking sounds or smacking sounds from your baby while breastfeeding.  Nipple pain. If you think your baby has not latched on correctly, slip your finger into the corner of your baby's mouth to break the suction and place it between your baby's gums. Attempt to start breastfeeding again. Signs of successful breastfeeding Signs from your baby  Your baby will gradually decrease the number of sucks or will completely stop sucking.  Your baby  will fall asleep.  Your baby's body will relax.  Your baby will retain a small amount of milk in his or her mouth.  Your baby will let go of your breast by himself or herself. Signs from you  Breasts that have increased in firmness, weight, and size 1-3 hours after feeding.  Breasts that are softer immediately after breastfeeding.  Increased milk volume, as well as a change in milk consistency and color by the fifth day of breastfeeding.  Nipples that are not sore, cracked, or bleeding. Signs that your baby is getting enough milk  Wetting at least 1-2 diapers during the first 24 hours after birth.  Wetting at least 5-6 diapers every 24 hours for the first week after birth. The urine should be clear or pale yellow by the age of 5 days.  Wetting 6-8 diapers every 24 hours as your baby continues to grow and develop.  At least 3 stools in a 24-hour period by the age of 5 days. The stool should be soft and yellow.  At least 3 stools in a 24-hour period by the age of 7 days. The stool should be seedy and yellow.  No loss of weight greater than 10% of birth weight during the first 3 days of life.  Average weight gain of 4-7 oz (113-198 g) per week after the age of 4 days.  Consistent daily weight gain by the age of 5 days, without weight loss after the age of 2 weeks. After a feeding, your baby may spit up a small amount of milk. This is normal. Breastfeeding frequency and duration Frequent feeding will help you make more milk and can prevent sore nipples and extremely full breasts (breast engorgement). Breastfeed when you feel the need to reduce the fullness of your breasts or when your baby shows signs of hunger. This is called "breastfeeding on demand." Signs that your baby is hungry include:  Increased alertness, activity, or restlessness.  Movement of the head from side to side.  Opening of the mouth when the corner of the mouth or cheek is stroked (rooting).  Increased  sucking sounds, smacking lips, cooing, sighing, or squeaking.  Hand-to-mouth movements and sucking on fingers or hands.  Fussing or crying. Avoid introducing a pacifier to your baby in the first 4-6 weeks after your baby is born. After this time, you may choose to use a pacifier. Research has shown that pacifier use during the first year of a baby's life decreases the risk of sudden infant death syndrome (SIDS). Allow your baby to feed on each breast as long as he or she wants. When your baby unlatches or falls asleep while feeding from the   first breast, offer the second breast. Because newborns are often sleepy in the first few weeks of life, you may need to awaken your baby to get him or her to feed. Breastfeeding times will vary from baby to baby. However, the following rules can serve as a guide to help you make sure that your baby is properly fed:  Newborns (babies 4 weeks of age or younger) may breastfeed every 1-3 hours.  Newborns should not go without breastfeeding for longer than 3 hours during the day or 5 hours during the night.  You should breastfeed your baby a minimum of 8 times in a 24-hour period. Breast milk pumping Pumping and storing breast milk allows you to make sure that your baby is exclusively fed your breast milk, even at times when you are unable to breastfeed. This is especially important if you go back to work while you are still breastfeeding, or if you are not able to be present during feedings. Your lactation consultant can help you find a method of pumping that works best for you and give you guidelines about how long it is safe to store breast milk.      Caring for your breasts while you breastfeed Nipples can become dry, cracked, and sore while breastfeeding. The following recommendations can help keep your breasts moisturized and healthy:  Avoid using soap on your nipples.  Wear a supportive bra designed especially for nursing. Avoid wearing underwire-style  bras or extremely tight bras (sports bras).  Air-dry your nipples for 3-4 minutes after each feeding.  Use only cotton bra pads to absorb leaked breast milk. Leaking of breast milk between feedings is normal.  Use lanolin on your nipples after breastfeeding. Lanolin helps to maintain your skin's normal moisture barrier. Pure lanolin is not harmful (not toxic) to your baby. You may also hand express a few drops of breast milk and gently massage that milk into your nipples and allow the milk to air-dry. In the first few weeks after giving birth, some women experience breast engorgement. Engorgement can make your breasts feel heavy, warm, and tender to the touch. Engorgement peaks within 3-5 days after you give birth. The following recommendations can help to ease engorgement:  Completely empty your breasts while breastfeeding or pumping. You may want to start by applying warm, moist heat (in the shower or with warm, water-soaked hand towels) just before feeding or pumping. This increases circulation and helps the milk flow. If your baby does not completely empty your breasts while breastfeeding, pump any extra milk after he or she is finished.  Apply ice packs to your breasts immediately after breastfeeding or pumping, unless this is too uncomfortable for you. To do this: ? Put ice in a plastic bag. ? Place a towel between your skin and the bag. ? Leave the ice on for 20 minutes, 2-3 times a day.  Make sure that your baby is latched on and positioned properly while breastfeeding. If engorgement persists after 48 hours of following these recommendations, contact your health care provider or a lactation consultant. Overall health care recommendations while breastfeeding  Eat 3 healthy meals and 3 snacks every day. Well-nourished mothers who are breastfeeding need an additional 450-500 calories a day. You can meet this requirement by increasing the amount of a balanced diet that you eat.  Drink  enough water to keep your urine pale yellow or clear.  Rest often, relax, and continue to take your prenatal vitamins to prevent fatigue, stress, and low   vitamin and mineral levels in your body (nutrient deficiencies).  Do not use any products that contain nicotine or tobacco, such as cigarettes and e-cigarettes. Your baby may be harmed by chemicals from cigarettes that pass into breast milk and exposure to secondhand smoke. If you need help quitting, ask your health care provider.  Avoid alcohol.  Do not use illegal drugs or marijuana.  Talk with your health care provider before taking any medicines. These include over-the-counter and prescription medicines as well as vitamins and herbal supplements. Some medicines that may be harmful to your baby can pass through breast milk.  It is possible to become pregnant while breastfeeding. If birth control is desired, ask your health care provider about options that will be safe while breastfeeding your baby. Where to find more information: [employer]: www.llli.org Contact a health care provider if:  You feel like you want to stop breastfeeding or have become frustrated with breastfeeding.  Your nipples are cracked or bleeding.  Your breasts are red, tender, or warm.  You have: ? Painful breasts or nipples. ? A swollen area on either breast. ? A fever or chills. ? Nausea or vomiting. ? Drainage other than breast milk from your nipples.  Your breasts do not become full before feedings by the fifth day after you give birth.  You feel sad and depressed.  Your baby is: ? Too sleepy to eat well. ? Having trouble sleeping. ? More than 1 week old and wetting fewer than 6 diapers in a 24-hour period. ? Not gaining weight by 5 days of age.  Your baby has fewer than 3 stools in a 24-hour period.  Your baby's skin or the Gessel parts of his or her eyes become yellow. Get help right away if:  Your baby is overly tired  (lethargic) and does not want to wake up and feed.  Your baby develops an unexplained fever. Summary  Breastfeeding offers many health benefits for infant and mothers.  Try to breastfeed your infant when he or she shows early signs of hunger.  Gently tickle or stroke your baby's lips with your finger or nipple to allow the baby to open his or her mouth. Bring the baby to your breast. Make sure that much of the areola is in your baby's mouth. Offer one side and burp the baby before you offer the other side.  Talk with your health care provider or lactation consultant if you have questions or you face problems as you breastfeed. This information is not intended to replace advice given to you by your health care provider. Make sure you discuss any questions you have with your health care provider. Document Revised: 04/25/2017 Document Reviewed: 03/02/2016 Elsevier Patient Education  2021 [employer] Inc.  

## 2020-07-21 LAB — GLUCOSE TOLERANCE, 2 HOURS W/ 1HR
Glucose, 1 hour: 103 mg/dL (ref 65–179)
Glucose, 2 hour: 67 mg/dL (ref 65–152)
Glucose, Fasting: 74 mg/dL (ref 65–91)

## 2020-07-21 LAB — CBC
Hematocrit: 36.9 % (ref 34.0–46.6)
Hemoglobin: 12.1 g/dL (ref 11.1–15.9)
MCH: 31.3 pg (ref 26.6–33.0)
MCHC: 32.8 g/dL (ref 31.5–35.7)
MCV: 95 fL (ref 79–97)
Platelets: 277 10*3/uL (ref 150–450)
RBC: 3.87 x10E6/uL (ref 3.77–5.28)
RDW: 12.4 % (ref 11.7–15.4)
WBC: 8.6 10*3/uL (ref 3.4–10.8)

## 2020-07-21 LAB — RPR: RPR Ser Ql: NONREACTIVE

## 2020-07-21 LAB — HIV ANTIBODY (ROUTINE TESTING W REFLEX): HIV Screen 4th Generation wRfx: NONREACTIVE

## 2020-08-08 ENCOUNTER — Encounter (HOSPITAL_COMMUNITY): Payer: Self-pay | Admitting: Family Medicine

## 2020-08-08 ENCOUNTER — Inpatient Hospital Stay (HOSPITAL_COMMUNITY)
Admission: AD | Admit: 2020-08-08 | Discharge: 2020-08-08 | Disposition: A | Discharge status: Home / Self Care | Payer: Medicaid Other | Attending: Family Medicine | Admitting: Family Medicine

## 2020-08-08 ENCOUNTER — Other Ambulatory Visit: Payer: Self-pay

## 2020-08-08 DIAGNOSIS — U071 COVID-19: Secondary | ICD-10-CM | POA: Diagnosis not present

## 2020-08-08 DIAGNOSIS — M545 Low back pain, unspecified: Secondary | ICD-10-CM | POA: Diagnosis not present

## 2020-08-08 DIAGNOSIS — O26893 Other specified pregnancy related conditions, third trimester: Secondary | ICD-10-CM | POA: Insufficient documentation

## 2020-08-08 DIAGNOSIS — Z87891 Personal history of nicotine dependence: Secondary | ICD-10-CM | POA: Diagnosis not present

## 2020-08-08 DIAGNOSIS — O99891 Other specified diseases and conditions complicating pregnancy: Secondary | ICD-10-CM | POA: Diagnosis not present

## 2020-08-08 DIAGNOSIS — O212 Late vomiting of pregnancy: Secondary | ICD-10-CM | POA: Diagnosis not present

## 2020-08-08 DIAGNOSIS — Z28310 Unvaccinated for covid-19: Secondary | ICD-10-CM | POA: Insufficient documentation

## 2020-08-08 DIAGNOSIS — Z3A28 28 weeks gestation of pregnancy: Secondary | ICD-10-CM

## 2020-08-08 DIAGNOSIS — O219 Vomiting of pregnancy, unspecified: Secondary | ICD-10-CM

## 2020-08-08 DIAGNOSIS — O98513 Other viral diseases complicating pregnancy, third trimester: Secondary | ICD-10-CM

## 2020-08-08 LAB — URINALYSIS, ROUTINE W REFLEX MICROSCOPIC
Bilirubin Urine: NEGATIVE
Glucose, UA: NEGATIVE mg/dL
Hgb urine dipstick: NEGATIVE
Ketones, ur: 20 mg/dL — AB
Leukocytes,Ua: NEGATIVE
Nitrite: NEGATIVE
Protein, ur: NEGATIVE mg/dL
Specific Gravity, Urine: 1.012 (ref 1.005–1.030)
pH: 8 (ref 5.0–8.0)

## 2020-08-08 LAB — RESP PANEL BY RT-PCR (FLU A&B, COVID) ARPGX2
Influenza A by PCR: NEGATIVE
Influenza B by PCR: NEGATIVE
SARS Coronavirus 2 by RT PCR: POSITIVE — AB

## 2020-08-08 MED ORDER — ACETAMINOPHEN 500 MG PO TABS
1000.0000 mg | ORAL_TABLET | Freq: Once | ORAL | Status: AC
  Administered 2020-08-08: 1000 mg via ORAL
  Filled 2020-08-08: qty 2

## 2020-08-08 MED ORDER — CYCLOBENZAPRINE HCL 5 MG PO TABS
10.0000 mg | ORAL_TABLET | Freq: Once | ORAL | Status: AC
  Administered 2020-08-08: 10 mg via ORAL
  Filled 2020-08-08: qty 2

## 2020-08-08 MED ORDER — BENZONATATE 200 MG PO CAPS: 200.0000 mg | ORAL_CAPSULE | Freq: Three times a day (TID) | ORAL | 0 refills | Status: DC | PRN

## 2020-08-08 MED ORDER — NIRMATRELVIR/RITONAVIR (PAXLOVID)TABLET: 3.0000 | ORAL_TABLET | Freq: Two times a day (BID) | ORAL | 0 refills | Status: AC

## 2020-08-08 MED ORDER — ACETAMINOPHEN 500 MG PO TABS: 1000.0000 mg | ORAL_TABLET | Freq: Four times a day (QID) | ORAL | 2 refills | Status: DC | PRN

## 2020-08-08 MED ORDER — ONDANSETRON 4 MG PO TBDP
8.0000 mg | ORAL_TABLET | Freq: Once | ORAL | Status: AC
  Administered 2020-08-08: 8 mg via ORAL
  Filled 2020-08-08: qty 2

## 2020-08-08 MED ORDER — ACETAMINOPHEN 500 MG PO TABS: 1000.0000 mg | ORAL_TABLET | Freq: Four times a day (QID) | ORAL | 1 refills | Status: DC | PRN

## 2020-08-08 MED ORDER — CYCLOBENZAPRINE HCL 10 MG PO TABS: 10.0000 mg | ORAL_TABLET | Freq: Three times a day (TID) | ORAL | 0 refills | Status: DC | PRN

## 2020-08-08 MED ORDER — ONDANSETRON 8 MG PO TBDP: 8.0000 mg | ORAL_TABLET | Freq: Three times a day (TID) | ORAL | 0 refills | Status: DC | PRN

## 2020-08-08 NOTE — MAU Note (Addendum)
Presents with c/o N/V, reports can't keep anything down.  Reports has nonproductive cough, sore throat, body aches, and chills.  Denies VB or LOF.  Endorses +FM.

## 2020-08-08 NOTE — MAU Provider Note (Signed)
History     CSN: [id number]  Arrival date and time: 08/08/20 1219   Event Date/Time   First Provider Initiated Contact with Patient 08/08/20 1332      Chief Complaint  Patient presents with   Nausea   Emesis   Sore Throat   HPI  Janet Rodriguez is a .30 y.o. female [phone] @ [redacted]w[redacted]d here with nausea/vomiting and sore throat. Sore throat x 1 day.  She has cough, body aches and chills. Has not had any contacts with Covid. She reports keeping down water but not meals. She has not tried anything for the symptoms. She has nausea and occasional vomiting. She is not taking any nausea medication for symptoms.   She is unvaccinated against Covid.   OB History     Gravida  5   Para  2   Term  2   Preterm      AB  2   Living  2      SAB  2   IAB      Ectopic      Multiple      Live Births  2           Past Medical History:  Diagnosis Date   BV (bacterial vaginosis)    Headache(784.0)    Herpes    History of pelvic fracture    Infection     Past Surgical History:  Procedure Laterality Date   ROOT CANAL      Family History  Problem Relation Age of Onset   Cancer Maternal Grandfather    Cancer Paternal Grandmother    Healthy Mother    Healthy Father     Social History   Tobacco Use   Smoking status: Former    Packs/day: 0.25    Years: 10.00    Pack years: 2.50    Types: Cigarettes    Quit date: 10/28/2011    Years since quitting: 8.7   Smokeless tobacco: Never   Tobacco comments:    NO longer smoking   Vaping Use   Vaping Use: Never used  Substance Use Topics   Alcohol use: Not Currently    Comment: occ   Drug use: No    Allergies: No Known Allergies  Medications Prior to Admission  Medication Sig Dispense Refill Last Dose   acetaminophen (TYLENOL) 325 MG tablet Take 650 mg by mouth every 6 (six) hours as needed.   08/07/2020   Prenatal Vit-Fe Fumarate-FA (PRENATAL VITAMIN) 27-0.8 MG TABS Take 1 tablet by mouth daily. 90 tablet 2  08/07/2020   Blood Pressure Monitor KIT 1 Device by Does not apply route once a week. To be monitored Regularly at home. 1 kit 0    Results for orders placed or performed during the hospital encounter of 08/08/20 (from the past 48 hour(s))  Resp Panel by RT-PCR (Flu A&B, Covid) Nasopharyngeal Swab     Status: Abnormal   Collection Time: 08/08/20  1:06 PM   Specimen: Nasopharyngeal Swab; Nasopharyngeal(NP) swabs in vial transport medium  Result Value Ref Range   SARS Coronavirus 2 by RT PCR POSITIVE (A) NEGATIVE    Comment: RESULT CALLED TO, READ BACK BY AND VERIFIED WITH: T LYTLE RN [id number] 08/08/20 A BROWNING (NOTE) SARS-CoV-2 target nucleic acids are DETECTED.  The SARS-CoV-2 RNA is generally detectable in upper respiratory specimens during the acute phase of infection. Positive results are indicative of the presence of the identified virus, but do not rule out bacterial infection or  co-infection with other pathogens not detected by the test. Clinical correlation with patient history and other diagnostic information is necessary to determine patient infection status. The expected result is Negative.  Fact Sheet for Patients: EntrepreneurPulse.com.au  Fact Sheet for Healthcare Providers: IncredibleEmployment.be  This test is not yet approved or cleared by the [place] FDA and  has been authorized for detection and/or diagnosis of SARS-CoV-2 by FDA under an Emergency Use Authorization (EUA).  This EUA will remain in effect (meaning this test can b e used) for the duration of  the COVID-19 declaration under Section 564(b)(1) of the Act, 21 U.S.C. section 360bbb-3(b)(1), unless the authorization is terminated or revoked sooner.     Influenza A by PCR NEGATIVE NEGATIVE   Influenza B by PCR NEGATIVE NEGATIVE    Comment: (NOTE) The Xpert Xpress SARS-CoV-2/FLU/RSV plus assay is intended as an aid in the diagnosis of influenza from Nasopharyngeal  swab specimens and should not be used as a sole basis for treatment. Nasal washings and aspirates are unacceptable for Xpert Xpress SARS-CoV-2/FLU/RSV testing.  Fact Sheet for Patients: EntrepreneurPulse.com.au  Fact Sheet for Healthcare Providers: IncredibleEmployment.be  This test is not yet approved or cleared by the [place] FDA and has been authorized for detection and/or diagnosis of SARS-CoV-2 by FDA under an Emergency Use Authorization (EUA). This EUA will remain in effect (meaning this test can be used) for the duration of the COVID-19 declaration under Section 564(b)(1) of the Act, 21 U.S.C. section 360bbb-3(b)(1), unless the authorization is terminated or revoked.  Performed at [facility], [place] [address] [place], [place] [address]   Urinalysis, Routine w reflex microscopic Urine, Clean Catch     Status: Abnormal   Collection Time: 08/08/20  1:25 PM  Result Value Ref Range   Color, Urine YELLOW YELLOW   APPearance CLEAR CLEAR   Specific Gravity, Urine 1.012 1.005 - 1.030   pH 8.0 5.0 - 8.0   Glucose, UA NEGATIVE NEGATIVE mg/dL   Hgb urine dipstick NEGATIVE NEGATIVE   Bilirubin Urine NEGATIVE NEGATIVE   Ketones, ur 20 (A) NEGATIVE mg/dL   Protein, ur NEGATIVE NEGATIVE mg/dL   Nitrite NEGATIVE NEGATIVE   Leukocytes,Ua NEGATIVE NEGATIVE    Comment: Performed at [facility] [address], [place],  [address]    Review of Systems  Constitutional:  Positive for chills. Negative for fever.  Respiratory:  Positive for cough.   Physical Exam   Blood pressure (!) 92/49, pulse 98, temperature 99.1 F (37.3 C), temperature source Oral, resp. rate 20, height 5' 5.5" (1.664 m), weight 63.7 kg, last menstrual period 12/18/2019, SpO2 100 %, unknown if currently breastfeeding.  Physical Exam Vitals and nursing note reviewed.  Constitutional:      General: She is not in acute distress.    Appearance: Normal  appearance. She is not ill-appearing, toxic-appearing or diaphoretic.  HENT:     Head: Normocephalic.  Eyes:     Pupils: Pupils are equal, round, and reactive to light.  Cardiovascular:     Rate and Rhythm: Normal rate.  Pulmonary:     Effort: Pulmonary effort is normal.     Breath sounds: Normal breath sounds.  Musculoskeletal:        General: Normal range of motion.  Skin:    General: Skin is warm.  Neurological:     Mental Status: She is alert and oriented to person, place, and time.   Fetal Tracing: Baseline: 150 bpm Variability: Moderate  Accelerations:  10x10 Decelerations: None Toco: None  MAU Course  Procedures None  MDM Urine shows mild dehydration> 20 Ketones. Zofran 8 mg given ODT, Patient given oral fluids and crackers and tolerated it.  Covid swab positive.   Assessment and Plan   A:  1. COVID-19 affecting pregnancy in third trimester   2. Nausea and vomiting during pregnancy   3. Acute midline low back pain without sciatica   4. [redacted] weeks gestation of pregnancy      P:  Discharge home in stable condition Rx: Paxlovid, Zofran, Flexeril, tylenol, Tessalon  Return to MAU if symptoms worsen  Increase oral fluid intake  Small, meals.  Lezlie Lye, NP 08/08/2020 6:40 PM

## 2020-08-10 ENCOUNTER — Telehealth (INDEPENDENT_AMBULATORY_CARE_PROVIDER_SITE_OTHER): Payer: Medicaid Other | Admitting: Women's Health

## 2020-08-10 ENCOUNTER — Encounter: Payer: Medicaid Other | Admitting: Women's Health

## 2020-08-10 VITALS — BP 102/63 | HR 82

## 2020-08-10 DIAGNOSIS — O98513 Other viral diseases complicating pregnancy, third trimester: Secondary | ICD-10-CM

## 2020-08-10 DIAGNOSIS — U071 COVID-19: Secondary | ICD-10-CM | POA: Insufficient documentation

## 2020-08-10 DIAGNOSIS — Z3A28 28 weeks gestation of pregnancy: Secondary | ICD-10-CM

## 2020-08-10 DIAGNOSIS — Z3480 Encounter for supervision of other normal pregnancy, unspecified trimester: Secondary | ICD-10-CM

## 2020-08-10 NOTE — Progress Notes (Deleted)
te

## 2020-08-10 NOTE — Progress Notes (Signed)
I connected with  Janet Rodriguez on 08/10/20 by a video enabled telemedicine application and verified that I am speaking with the correct person using two identifiers.   I discussed the limitations of evaluation and management by telemedicine. The patient expressed understanding and agreed to proceed.   PATIENT: HOME PROVIDER: [facility]  MyChart OB, she tested + for  Covid this week but she is feeling better today just a bit congested. Patient is unable to check BP today, she does not have her BP cuff.

## 2020-08-10 NOTE — Patient Instructions (Addendum)
Maternity Assessment Unit (MAU)  The Maternity Assessment Unit (MAU) is located at the [facility] and [facility]. The address is: [address] [place], [place], [place] [address]. Please see map below for additional directions.    The Maternity Assessment Unit is designed to help you during your pregnancy, and for up to 6 weeks after delivery, with any pregnancy- or postpartum-related emergencies, if you think you are in labor, or if your water has broken. For example, if you experience nausea and vomiting, vaginal bleeding, severe abdominal or pelvic pain, elevated blood pressure or other problems related to your pregnancy or postpartum time, please come to the Maternity Assessment Unit for assistance.       Oral Glucose Tolerance Test During Pregnancy Why am I having this test? The oral glucose tolerance test (OGTT) is done to check how your body processes blood sugar (glucose). This is one of several tests used to diagnose diabetes that develops during pregnancy (gestational diabetes mellitus). Gestational diabetes is a short-term form of diabetes that some women develop while they are pregnant. It usually occurs during the second trimesterof pregnancy and goes away after delivery. Testing, or screening, for gestational diabetes usually occurs at weeks 24-28 of pregnancy. You may have the OGTT test after having a 1-hour glucose screening test if the results from that test indicate that you may have gestational diabetes. This test may also be needed if: You have a history of gestational diabetes. There is a history of giving birth to very large babies or of losing pregnancies (having stillbirths). You have signs and symptoms of diabetes, such as: Changes in your eyesight. Tingling or numbness in your hands or feet. Changes in hunger, thirst, and urination, and these are not explained by your pregnancy. What is being tested? This test measures the amount  of glucose in your blood at different timesduring a period of 3 hours. This shows how well your body can process glucose. What kind of sample is taken?  Blood samples are required for this test. They are usually collected byinserting a needle into a blood vessel. How do I prepare for this test? For 3 days before your test, eat normally. Have plenty of carbohydrate-rich foods. Follow instructions from your health care provider about: Eating or drinking restrictions on the day of the test. You may be asked not to eat or drink anything other than water (to fast) starting 8-10 hours before the test. Changing or stopping your regular medicines. Some medicines may interfere with this test. Tell a health care provider about: All medicines you are taking, including vitamins, herbs, eye drops, creams, and over-the-counter medicines. Any blood disorders you have. Any surgeries you have had. Any medical conditions you have. What happens during the test? First, your blood glucose will be measured. This is referred to as your fasting blood glucose because you fasted before the test. Then, you will drink a glucose solution that contains a certain amount of glucose. Your blood glucosewill be measured again 1, 2, and 3 hours after you drink the solution. This test takes about 3 hours to complete. You will need to stay at the testing location during this time. During the testing period: Do not eat or drink anything other than the glucose solution. Do not exercise. Do not use any products that contain nicotine or tobacco, such as cigarettes, e-cigarettes, and chewing tobacco. These can affect your test results. If you need help quitting, ask your health care provider. The testing  procedure may vary among health care providers and hospitals. How are the results reported? Your results will be reported as milligrams of glucose per deciliter of blood (mg/dL) or millimoles per liter (mmol/L). There is more than one  source for screening and diagnosis reference values used to diagnose gestational diabetes. Your health care provider will compare your results to normal values that were established after testing a large group of people (reference values). Reference values may vary among labs and hospitals. For this test (Carpenter-Coustan), reference values are: Fasting: 95 mg/dL (5.3 mmol/L). 1 hour: 180 mg/dL (78.2 mmol/L). 2 hour: 155 mg/dL (8.6 mmol/L). 3 hour: 140 mg/dL (7.8 mmol/L). What do the results mean? Results below the reference values are considered normal. If two or more of your blood glucose levels are at or above the reference values, you may be diagnosed with gestational diabetes. If only one level is high, your healthcare provider may suggest repeat testing or other tests to confirm a diagnosis. Talk with your health care provider about what your results mean. Questions to ask your health care provider Ask your health care provider, or the department that is doing the test: When will my results be ready? How will I get my results? What are my treatment options? What other tests do I need? What are my next steps? Summary The oral glucose tolerance test (OGTT) is one of several tests used to diagnose diabetes that develops during pregnancy (gestational diabetes mellitus). Gestational diabetes is a short-term form of diabetes that some women develop while they are pregnant. You may have the OGTT test after having a 1-hour glucose screening test if the results from that test show that you may have gestational diabetes. You may also have this test if you have any symptoms or risk factors for this type of diabetes. Talk with your health care provider about what your results mean. This information is not intended to replace advice given to you by your health care provider. Make sure you discuss any questions you have with your healthcare provider. Document Revised: 07/09/2019 Document Reviewed:  07/09/2019 Elsevier Patient Education  2022 Elsevier Inc.       Preterm Labor The normal length of a pregnancy is 39-41 weeks. Preterm labor is when labor starts before 37 completed weeks of pregnancy. Babies who are born prematurely and survive may not be fully developed and may be at an increased risk for long-term problems such as cerebral palsy, developmental delays, and vision andhearing problems. Babies who are born too early may have problems soon after birth. Premature babies may have problems regulating blood sugar, body temperature, heart rate, and breathing rate. These babies often have trouble with feeding. The risk ofhaving problems is highest for babies who are born before 34 weeks of pregnancy. What are the causes? The exact cause of this condition is not known. What increases the risk? You are more likely to have preterm labor if you have certain risk factors that relate to your medical history, problems with present and past pregnancies, andlifestyle factors. Medical history You have abnormalities of the uterus, including a short cervix. You have STIs (sexually transmitted infections) or other infections of the urinary tract and the vagina. You have chronic illnesses, such as blood clotting problems, diabetes, or high blood pressure. You are overweight or underweight. Present and past pregnancies You have had preterm labor before. You are pregnant with twins or other multiples. You have been diagnosed with a condition in which the placenta covers your cervix (placenta  previa). You waited less than 18 months between giving birth and becoming pregnant again. Your unborn baby has some abnormalities. You have vaginal bleeding during pregnancy. You became pregnant through in vitro fertilization (IVF). Lifestyle and environmental factors You use tobacco products or drink alcohol. You use drugs. You have stress and no social support. You experience domestic violence. You  are exposed to certain chemicals or environmental pollutants. Other factors You are younger than age 4 or older than age 26. What are the signs or symptoms? Symptoms of this condition include: Cramps similar to those that can happen during a menstrual period. The cramps may happen with diarrhea. Pain in the abdomen or lower back. Regular contractions that may feel like tightening of the abdomen. A feeling of increased pressure in the pelvis. Increased watery or bloody mucus discharge from the vagina. Water breaking (ruptured amniotic sac). How is this diagnosed? This condition is diagnosed based on: Your medical history and a physical exam. A pelvic exam. An ultrasound. Monitoring your uterus for contractions. Other tests, including: A swab of the cervix to check for a chemical called fetal fibronectin. Urine tests. How is this treated? Treatment for this condition depends on the length of your pregnancy, your condition, and the health of your baby. Treatment may include: Taking medicines, such as: Hormone medicines. These may be given early in pregnancy to help support the pregnancy. Medicines to stop contractions. Medicines to help mature the baby's lungs. These may be prescribed if the risk of delivery is high. Medicines to help protect your baby from brain and nerve complications such as cerebral palsy. Bed rest. If the labor happens before 34 weeks of pregnancy, you may need to stay in the hospital. Delivery of the baby. Follow these instructions at home:  Do not use any products that contain nicotine or tobacco. These products include cigarettes, chewing tobacco, and vaping devices, such as e-cigarettes. If you need help quitting, ask your health care provider. Do not drink alcohol. Take over-the-counter and prescription medicines only as told by your health care provider. Rest as told by your health care provider. Return to your normal activities as told by your health care  provider. Ask your health care provider what activities are safe for you. Keep all follow-up visits. This is important. How is this prevented? To increase your chance of having a full-term pregnancy: Do not use drugs or take medicines that have not been prescribed to you during your pregnancy. Talk with your health care provider before taking any herbal supplements, even if you have been taking them regularly. Make sure you gain a healthy amount of weight during your pregnancy. Watch for infection. If you think that you might have an infection, get it checked right away. Symptoms of infection may include: Fever. Abnormal vaginal discharge or discharge that smells bad. Pain or burning with urination. Needing to urinate urgently. Frequently urinating or passing small amounts of urine frequently. Blood in your urine or urine that smells bad or unusual. Where to find more information U.S. Department of Health and Cytogeneticist on Women's Health: http://hoffman.com/ [employer]st[employer]ians [employer]necologists: www.acog.org Centers for Disease Control and Prevention, Preterm Birth: FootballExhibition.com.br Contact a health care provider if: You think you are going into preterm labor. You have signs or symptoms of preterm labor. You have symptoms of infection. Get help right away if: You are having regular, painful contractions every 5 minutes or less. Your water breaks. Summary Preterm labor is labor that starts  before you reach 37 weeks of pregnancy. Delivering your baby early increases your baby's risk of developing long-term problems. You are more likely to have preterm labor if you have certain risk factors that relate to your medical history, problems with present and past pregnancies, and lifestyle factors. Keep all follow-up visits. This is important. Contact a health care provider if you have signs or symptoms of preterm labor. This information is not intended to replace  advice given to you by your health care provider. Make sure you discuss any questions you have with your healthcare provider. Document Revised: 02/02/2020 Document Reviewed: 02/02/2020 Elsevier Patient Education  2022 Elsevier Inc.                        Safe Medications in Pregnancy    Acne: Benzoyl Peroxide Salicylic Acid  Backache/Headache: Tylenol: 2 regular strength every 4 hours OR              2 Extra strength every 6 hours  Colds/Coughs/Allergies: Benadryl (alcohol free) 25 mg every 6 hours as needed Breath right strips Claritin Cepacol throat lozenges Chloraseptic throat spray Cold-Eeze- up to three times per day Cough drops, alcohol free Flonase (by prescription only) Guaifenesin Mucinex Robitussin DM (plain only, alcohol free) Saline nasal spray/drops Sudafed (pseudoephedrine) & Actifed ** use only after [redacted] weeks gestation and if you do not have high blood pressure Tylenol Vicks Vaporub Zinc lozenges Zyrtec   Constipation: Colace Ducolax suppositories Fleet enema Glycerin suppositories Metamucil Milk of magnesia Miralax Senokot Smooth move tea  Diarrhea: Kaopectate Imodium A-D  *NO pepto Bismol  Hemorrhoids: Anusol Anusol HC Preparation H Tucks  Indigestion: Tums Maalox Mylanta Zantac  Pepcid  Insomnia: Benadryl (alcohol free) 25mg  every 6 hours as needed Tylenol PM Unisom, no Gelcaps  Leg Cramps: Tums MagGel  Nausea/Vomiting:  Bonine Dramamine Emetrol Ginger extract Sea bands Meclizine  Nausea medication to take during pregnancy:  Unisom (doxylamine succinate 25 mg tablets) Take one tablet daily at bedtime. If symptoms are not adequately controlled, the dose can be increased to a maximum recommended dose of two tablets daily (1/2 tablet in the morning, 1/2 tablet mid-afternoon and one at bedtime). Vitamin B6 100mg  tablets. Take one tablet twice a day (up to 200 mg per day).  Skin Rashes: Aveeno products Benadryl  cream or 25mg  every 6 hours as needed Calamine Lotion 1% cortisone cream  Yeast infection: Gyne-lotrimin 7 Monistat 7   **If taking multiple medications, please check labels to avoid duplicating the same active ingredients **take medication as directed on the label ** Do not exceed 4000 mg of tylenol in 24 hours **Do not take medications that contain aspirin or ibuprofen

## 2020-08-10 NOTE — Progress Notes (Addendum)
I connected with Janet Rodriguez 08/10/20 at  3:50 PM EDT by: MyChart video and verified that I am speaking with the correct person using two identifiers.  Patient is located at home and provider is located at [facility]     The purpose of this virtual visit is to provide medical care while limiting exposure to the novel coronavirus. I discussed the limitations, risks, security and privacy concerns of performing an evaluation and management service by MyChart video and the availability of in person appointments. I also discussed with the patient that there may be a patient responsible charge related to this service. By engaging in this virtual visit, you consent to the provision of healthcare.  Additionally, you authorize for your insurance to be billed for the services provided during this visit.  The patient expressed understanding and agreed to proceed.  The following staff members participated in the virtual visit:  Donia Ast    PRENATAL VISIT NOTE  Subjective:  Janet Rodriguez is a 30 y.o. [id number] at [redacted]w[redacted]d  for phone visit for ongoing prenatal care.  She is currently monitored for the following issues for this low-risk pregnancy and has Supervision of normal pregnancy, antepartum and COVID-19 affecting pregnancy in third trimester on their problem list.  Patient reports  minor congestion, but improving, was dx with COVD 08/08/2020 .  Contractions: Irritability. Vag. Bleeding: None.  Movement: Present. Denies leaking of fluid.   The following portions of the patient's history were reviewed and updated as appropriate: allergies, current medications, past family history, past medical history, past social history, past surgical history and problem list.   Objective:   Vitals:   08/10/20 1618  BP: 102/63  Pulse: 82   Self-obtained.  Fetal Status:     Movement: Present     Assessment and Plan:  Pregnancy: [id number] at [redacted]w[redacted]d  1. Supervision of other normal pregnancy, antepartum  2.  COVID-19 affecting pregnancy in third trimester -pt reports feeling better, just having congestion -dx 08/08/2020 -safe meds in pregnancy list given for symptomatic treatment -pt taking Paxlovid  3. [redacted] weeks gestation of pregnancy  Preterm labor symptoms and general obstetric precautions including but not limited to vaginal bleeding, contractions, leaking of fluid and fetal movement were reviewed in detail with the patient. I discussed the assessment and treatment plan with the patient. The patient was provided an opportunity to ask questions and all were answered. The patient agreed with the plan and demonstrated an understanding of the instructions. The patient was advised to call back or seek an in-person office evaluation/go to MAU at [facility] for any urgent or concerning symptoms.  Return in about 2 weeks (around 08/24/2020) for in-person LOB/APP OK.  No future appointments.    Time spent on virtual visit: 5 minutes  Marylen Ponto, NP

## 2020-08-24 ENCOUNTER — Ambulatory Visit (INDEPENDENT_AMBULATORY_CARE_PROVIDER_SITE_OTHER): Payer: Medicaid Other | Admitting: Women's Health

## 2020-08-24 ENCOUNTER — Other Ambulatory Visit: Payer: Self-pay

## 2020-08-24 VITALS — BP 109/68 | HR 82 | Wt 144.0 lb

## 2020-08-24 DIAGNOSIS — Z23 Encounter for immunization: Secondary | ICD-10-CM

## 2020-08-24 DIAGNOSIS — Z3A30 30 weeks gestation of pregnancy: Secondary | ICD-10-CM

## 2020-08-24 DIAGNOSIS — O98513 Other viral diseases complicating pregnancy, third trimester: Secondary | ICD-10-CM

## 2020-08-24 DIAGNOSIS — Z3480 Encounter for supervision of other normal pregnancy, unspecified trimester: Secondary | ICD-10-CM

## 2020-08-24 DIAGNOSIS — U071 COVID-19: Secondary | ICD-10-CM

## 2020-08-24 NOTE — Progress Notes (Signed)
Subjective:  Janet Rodriguez is a 30 y.o. [id number] at [redacted]w[redacted]d being seen today for ongoing prenatal care.  She is currently monitored for the following issues for this low-risk pregnancy and has Supervision of normal pregnancy, antepartum and COVID-19 affecting pregnancy in third trimester on their problem list.  Patient reports no complaints.  Contractions: Irritability. Vag. Bleeding: None.  Movement: Present. Denies leaking of fluid.   The following portions of the patient's history were reviewed and updated as appropriate: allergies, current medications, past family history, past medical history, past social history, past surgical history and problem list. Problem list updated.  Objective:   Vitals:   08/24/20 1608  BP: 109/68  Pulse: 82  Weight: 144 lb (65.3 kg)    Fetal Status: Fetal Heart Rate (bpm): 160 Fundal Height: 30 cm Movement: Present     General:  Alert, oriented and cooperative. Patient is in no acute distress.  Skin: Skin is warm and dry. No rash noted.   Cardiovascular: Normal heart rate noted  Respiratory: Normal respiratory effort, no problems with respiration noted  Abdomen: Soft, gravid, appropriate for gestational age. Pain/Pressure: Present     Pelvic: Vag. Bleeding: None     Cervical exam deferred        Extremities: Normal range of motion.  Edema: Trace  Mental Status: Normal mood and affect. Normal behavior. Normal judgment and thought content.   Urinalysis:      Assessment and Plan:  Pregnancy: [id number] at [redacted]w[redacted]d  1. Supervision of other normal pregnancy, antepartum -discussed Tdap, pt accepts  2. COVID-19 affecting pregnancy in third trimester  3. [redacted] weeks gestation of pregnancy  Preterm labor symptoms and general obstetric precautions including but not limited to vaginal bleeding, contractions, leaking of fluid and fetal movement were reviewed in detail with the patient. I discussed the assessment and treatment plan with the patient. The patient was  provided an opportunity to ask questions and all were answered. The patient agreed with the plan and demonstrated an understanding of the instructions. The patient was advised to call back or seek an in-person office evaluation/go to MAU at [facility] for any urgent or concerning symptoms. Please refer to After Visit Summary for other counseling recommendations.  Return in about 2 weeks (around 09/07/2020) for in-person LOB/APP OK.   Irais Mottram, Odie Sera, NP

## 2020-08-24 NOTE — Addendum Note (Signed)
Addended by: Natale Milch D on: 08/24/2020 04:54 PM   Modules accepted: Orders

## 2020-08-24 NOTE — Patient Instructions (Addendum)
Maternity Assessment Unit (MAU)  The Maternity Assessment Unit (MAU) is located at the [facility] and [place] at [facility]. The address is: [address] [place], [place], [place] [address]. Please see map below for additional directions.    The Maternity Assessment Unit is designed to help you during your pregnancy, and for up to 6 weeks after delivery, with any pregnancy- or postpartum-related emergencies, if you think you are in labor, or if your water has broken. For example, if you experience nausea and vomiting, vaginal bleeding, severe abdominal or pelvic pain, elevated blood pressure or other problems related to your pregnancy or postpartum time, please come to the Maternity Assessment Unit for assistance.       Preterm Labor The normal length of a pregnancy is 39-41 weeks. Preterm labor is when labor starts before 37 completed weeks of pregnancy. Babies who are born prematurely and survive may not be fully developed and may be at an increased risk for long-term problems such as cerebral palsy, developmental delays, and vision andhearing problems. Babies who are born too early may have problems soon after birth. Premature babies may have problems regulating blood sugar, body temperature, heart rate, and breathing rate. These babies often have trouble with feeding. The risk ofhaving problems is highest for babies who are born before 39 weeks of pregnancy. What are the causes? The exact cause of this condition is not known. What increases the risk? You are more likely to have preterm labor if you have certain risk factors that relate to your medical history, problems with present and past pregnancies, andlifestyle factors. Medical history You have abnormalities of the uterus, including a short cervix. You have STIs (sexually transmitted infections) or other infections of the urinary tract and the vagina. You have chronic illnesses, such as blood clotting  problems, diabetes, or high blood pressure. You are overweight or underweight. Present and past pregnancies You have had preterm labor before. You are pregnant with twins or other multiples. You have been diagnosed with a condition in which the placenta covers your cervix (placenta previa). You waited less than 18 months between giving birth and becoming pregnant again. Your unborn baby has some abnormalities. You have vaginal bleeding during pregnancy. You became pregnant through in vitro fertilization (IVF). Lifestyle and environmental factors You use tobacco products or drink alcohol. You use drugs. You have stress and no social support. You experience domestic violence. You are exposed to certain chemicals or environmental pollutants. Other factors You are younger than age 94 or older than age 43. What are the signs or symptoms? Symptoms of this condition include: Cramps similar to those that can happen during a menstrual period. The cramps may happen with diarrhea. Pain in the abdomen or lower back. Regular contractions that may feel like tightening of the abdomen. A feeling of increased pressure in the pelvis. Increased watery or bloody mucus discharge from the vagina. Water breaking (ruptured amniotic sac). How is this diagnosed? This condition is diagnosed based on: Your medical history and a physical exam. A pelvic exam. An ultrasound. Monitoring your uterus for contractions. Other tests, including: A swab of the cervix to check for a chemical called fetal fibronectin. Urine tests. How is this treated? Treatment for this condition depends on the length of your pregnancy, your condition, and the health of your baby. Treatment may include: Taking medicines, such as: Hormone medicines. These may be given early in pregnancy to help support the pregnancy. Medicines to stop contractions. Medicines to  help mature the baby's lungs. These may be prescribed if the risk of  delivery is high. Medicines to help protect your baby from brain and nerve complications such as cerebral palsy. Bed rest. If the labor happens before 34 weeks of pregnancy, you may need to stay in the hospital. Delivery of the baby. Follow these instructions at home:  Do not use any products that contain nicotine or tobacco. These products include cigarettes, chewing tobacco, and vaping devices, such as e-cigarettes. If you need help quitting, ask your health care provider. Do not drink alcohol. Take over-the-counter and prescription medicines only as told by your health care provider. Rest as told by your health care provider. Return to your normal activities as told by your health care provider. Ask your health care provider what activities are safe for you. Keep all follow-up visits. This is important. How is this prevented? To increase your chance of having a full-term pregnancy: Do not use drugs or take medicines that have not been prescribed to you during your pregnancy. Talk with your health care provider before taking any herbal supplements, even if you have been taking them regularly. Make sure you gain a healthy amount of weight during your pregnancy. Watch for infection. If you think that you might have an infection, get it checked right away. Symptoms of infection may include: Fever. Abnormal vaginal discharge or discharge that smells bad. Pain or burning with urination. Needing to urinate urgently. Frequently urinating or passing small amounts of urine frequently. Blood in your urine or urine that smells bad or unusual. Where to find more information U.S. Department of Health and Cytogeneticist on Women's Health: http://hoffman.com/ [employer] of [employer]stetricians [employer] Gynecologists: www.acog.org Centers for Disease Control and Prevention, Preterm Birth: FootballExhibition.com.br Contact a health care provider if: You think you are going into preterm labor. You have signs  or symptoms of preterm labor. You have symptoms of infection. Get help right away if: You are having regular, painful contractions every 5 minutes or less. Your water breaks. Summary Preterm labor is labor that starts before you reach 37 weeks of pregnancy. Delivering your baby early increases your baby's risk of developing long-term problems. You are more likely to have preterm labor if you have certain risk factors that relate to your medical history, problems with present and past pregnancies, and lifestyle factors. Keep all follow-up visits. This is important. Contact a health care provider if you have signs or symptoms of preterm labor. This information is not intended to replace advice given to you by your health care provider. Make sure you discuss any questions you have with your healthcare provider. Document Revised: 02/02/2020 Document Reviewed: 02/02/2020 Elsevier Patient Education  2022 [employer].       [email or web address]">  Third Trimester of Pregnancy  The third trimester of pregnancy is from week 28 through week 40. This is months 7 through 9. The third trimester is a time when the unborn baby (fetus) is growing rapidly. At the end of the ninth month, the fetus is about 20inches long and weighs 6-10 pounds. Body changes during your third trimester During the third trimester, your body will continue to go through many changes.The changes vary and generally return to normal after your baby is born. Physical changes Your weight will continue to increase. You can expect to gain 25-35 pounds (11-16 kg) by the end of the pregnancy if you begin pregnancy at a normal weight. If you are underweight, you can expect to gain  28-40 lb (about 13-18 kg), and if you are overweight, you can expect to gain 15-25 lb (about 7-11 kg). You may begin to get stretch marks on your hips, abdomen, and breasts. Your breasts will continue to  grow and may hurt. A yellow fluid (colostrum) may leak from your breasts. This is the first milk you are producing for your baby. You may have changes in your hair. These can include thickening of your hair, rapid growth, and changes in texture. Some people also have hair loss during or after pregnancy, or hair that feels dry or thin. Your belly button may stick out. You may notice more swelling in your hands, face, or ankles. Health changes You may have heartburn. You may have constipation. You may develop hemorrhoids. You may develop swollen, bulging veins (varicose veins) in your legs. You may have increased body aches in the pelvis, back, or thighs. This is due to weight gain and increased hormones that are relaxing your joints. You may have increased tingling or numbness in your hands, arms, and legs. The skin on your abdomen may also feel numb. You may feel short of breath because of your expanding uterus. Other changes You may urinate more often because the fetus is moving lower into your pelvis and pressing on your bladder. You may have more problems sleeping. This may be caused by the size of your abdomen, an increased need to urinate, and an increase in your body's metabolism. You may notice the fetus "dropping," or moving lower in your abdomen (lightening). You may have increased vaginal discharge. You may notice that you have pain around your pelvic bone as your uterus distends. Follow these instructions at home: Medicines Follow your health care provider's instructions regarding medicine use. Specific medicines may be either safe or unsafe to take during pregnancy. Do not take any medicines unless approved by your health care provider. Take a prenatal vitamin that contains at least 600 micrograms (mcg) of folic acid. Eating and drinking Eat a healthy diet that includes fresh fruits and vegetables, whole grains, good sources of protein such as meat, eggs, or tofu, and low-fat dairy  products. Avoid raw meat and unpasteurized juice, milk, and cheese. These carry germs that can harm you and your baby. Eat 4 or 5 small meals rather than 3 large meals a day. You may need to take these actions to prevent or treat constipation: Drink enough fluid to keep your urine pale yellow. Eat foods that are high in fiber, such as beans, whole grains, and fresh fruits and vegetables. Limit foods that are high in fat and processed sugars, such as fried or sweet foods. Activity Exercise only as directed by your health care provider. Most people can continue their usual exercise routine during pregnancy. Try to exercise for 30 minutes at least 5 days a week. Stop exercising if you experience contractions in the uterus. Stop exercising if you develop pain or cramping in the lower abdomen or lower back. Avoid heavy lifting. Do not exercise if it is very hot or humid or if you are at a high altitude. If you choose to, you may continue to have sex unless your health care provider tells you not to. Relieving pain and discomfort Take frequent breaks and rest with your legs raised (elevated) if you have leg cramps or low back pain. Take warm sitz baths to soothe any pain or discomfort caused by hemorrhoids. Use hemorrhoid cream if your health care provider approves. Wear a supportive bra to prevent  discomfort from breast tenderness. If you develop varicose veins: Wear support hose as told by your health care provider. Elevate your feet for 15 minutes, 3-4 times a day. Limit salt in your diet. Safety Talk to your health care provider before traveling far distances. Do not use hot tubs, steam rooms, or saunas. Wear your seat belt at all times when driving or riding in a car. Talk with your health care provider if someone is verbally or physically abusive to you. Preparing for birth To prepare for the arrival of your baby: Take prenatal classes to understand, practice, and ask questions about  labor and delivery. Visit the hospital and tour the maternity area. Purchase a rear-facing car seat and make sure you know how to install it in your car. Prepare the baby's room or sleeping area. Make sure to remove all pillows and stuffed animals from the baby's crib to prevent suffocation. General instructions Avoid cat litter boxes and soil used by cats. These carry germs that can cause birth defects in the baby. If you have a cat, ask someone to clean the litter box for you. Do not douche or use tampons. Do not use scented sanitary pads. Do not use any products that contain nicotine or tobacco, such as cigarettes, e-cigarettes, and chewing tobacco. If you need help quitting, ask your health care provider. Do not use any herbal remedies, illegal drugs, or medicines that were not prescribed to you. Chemicals in these products can harm your baby. Do not drink alcohol. You will have more frequent prenatal exams during the third trimester. During a routine prenatal visit, your health care provider will do a physical exam, perform tests, and discuss your overall health. Keep all follow-up visits. This is important. Where to find more information American Pregnancy Association: americanpregnancy.org [employer] of Obstetricians and Gynecologists: https://www.todd-brady.net/ Office on [employer] Health: MightyReward.co.nz Contact a health care provider if you have: A fever. Mild pelvic cramps, pelvic pressure, or nagging pain in your abdominal area or lower back. Vomiting or diarrhea. Bad-smelling vaginal discharge or foul-smelling urine. Pain when you urinate. A headache that does not go away when you take medicine. Visual changes or see spots in front of your eyes. Get help right away if: Your water breaks. You have regular contractions less than 5 minutes apart. You have spotting or bleeding from your vagina. You have severe abdominal pain. You have difficulty  breathing. You have chest pain. You have fainting spells. You have not felt your baby move for the time period told by your health care provider. You have new or increased pain, swelling, or redness in an arm or leg. Summary The third trimester of pregnancy is from week 28 through week 40 (months 7 through 9). You may have more problems sleeping. This can be caused by the size of your abdomen, an increased need to urinate, and an increase in your body's metabolism. You will have more frequent prenatal exams during the third trimester. Keep all follow-up visits. This is important. This information is not intended to replace advice given to you by your health care provider. Make sure you discuss any questions you have with your healthcare provider. Document Revised: 07/08/2019 Document Reviewed: 05/14/2019 Elsevier Patient Education  2022 Elsevier Inc.       Tdap (Tetanus, Diphtheria, Pertussis) Vaccine: What You Need to Know 1. Why get vaccinated? Tdap vaccine can prevent tetanus, diphtheria, and pertussis. Diphtheria and pertussis spread from person to person. Tetanus enters the body through cuts or wounds.  TETANUS (T) causes painful stiffening of the muscles. Tetanus can lead to serious health problems, including being unable to open the mouth, having trouble swallowing and breathing, or death. DIPHTHERIA (D) can lead to difficulty breathing, heart failure, paralysis, or death. PERTUSSIS (aP), also known as "whooping cough," can cause uncontrollable, violent coughing that makes it hard to breathe, eat, or drink. Pertussis can be extremely serious especially in babies and young children, causing pneumonia, convulsions, brain damage, or death. In teens and adults, it can cause weight loss, loss of bladder control, passing out, and rib fractures from severe coughing. 2. Tdap vaccine Tdap is only for children 7 years and older, adolescents, and adults.  Adolescents should receive a single  dose of Tdap, preferably at age 76 or 12 years. Pregnant people should get a dose of Tdap during every pregnancy, preferably during the early part of the third trimester, to help protect the newborn from pertussis. Infants are most at risk for severe, life-threatening complications frompertussis. Adults who have never received Tdap should get a dose of Tdap. Also, adults should receive a booster dose of either Tdap or Td (a different vaccine that protects against tetanus and diphtheria but not pertussis) every 10 years, or after 5 years in the case of a severe or dirty wound or burn. Tdap may be given at the same time as other vaccines. 3. Talk with your health care provider Tell your vaccine provider if the person getting the vaccine: Has had an allergic reaction after a previous dose of any vaccine that protects against tetanus, diphtheria, or pertussis, or has any severe, life-threatening allergies Has had a coma, decreased level of consciousness, or prolonged seizures within 7 days after a previous dose of any pertussis vaccine (DTP, DTaP, or Tdap) Has seizures or another nervous system problem Has ever had Guillain-Barr Syndrome (also called "GBS") Has had severe pain or swelling after a previous dose of any vaccine that protects against tetanus or diphtheria In some cases, your health care provider may decide to postpone Tdapvaccination until a future visit. People with minor illnesses, such as a cold, may be vaccinated. People who are moderately or severely ill should usually wait until they recover beforegetting Tdap vaccine.  Your health care provider can give you more information. 4. Risks of a vaccine reaction Pain, redness, or swelling where the shot was given, mild fever, headache, feeling tired, and nausea, vomiting, diarrhea, or stomachache sometimes happen after Tdap vaccination. People sometimes faint after medical procedures, including vaccination. Tellyour provider if you feel  dizzy or have vision changes or ringing in the ears.  As with any medicine, there is a very remote chance of a vaccine causing asevere allergic reaction, other serious injury, or death. 5. What if there is a serious problem? An allergic reaction could occur after the vaccinated person leaves the clinic. If you see signs of a severe allergic reaction (hives, swelling of the face and throat, difficulty breathing, a fast heartbeat, dizziness, or weakness), call [id number] get the person to the nearest hospital. For other signs that concern you, call your health care provider.  Adverse reactions should be reported to the Vaccine Adverse Event Reporting System (VAERS). Your health care provider will usually file this report, or you can do it yourself. Visit the VAERS website at www.vaers.LAgents.no or call 719-107-8666. VAERS is only for reporting reactions, and VAERS staff members do not give medical advice. 6. The National Vaccine Injury Compensation Program The [employer] Vaccine Injury Compensation Program (VICP) is  a federal program that was created to compensate people who may have been injured by certain vaccines. Claims regarding alleged injury or death due to vaccination have a time limit for filing, which may be as short as two years. Visit the VICP website at SpiritualWord.atwww.hrsa.gov/vaccinecompensation or call 442-277-28191-800-338-2382to learn about the program and about filing a claim. 7. How can I learn more? Ask your health care provider. Call your local or state health department. Visit the website of the Food and Drug Administration (FDA) for vaccine package inserts and additional information at FinderList.nowww.fda.gov/vaccines-blood-biologics/vaccines. Contact the Centers for Disease Control and Prevention (CDC): Call (959)744-52051-331-524-5546 (1-800-CDC-INFO) or Visit CDC's website at PicCapture.uywww.cdc.gov/vaccines. Vaccine Information Statement Tdap (Tetanus, Diphtheria, Pertussis) Vaccine(09/18/2019) This information is not intended to replace  advice given to you by your health care provider. Make sure you discuss any questions you have with your healthcare provider. Document Revised: 10/14/2019 Document Reviewed: 10/14/2019 Elsevier Patient Education  2022 [employer]

## 2020-08-24 NOTE — Progress Notes (Signed)
ROB [redacted]w[redacted]d  KQ:ASUO  Pt asked when next U/S will be advised to discuss w/ provider.

## 2020-09-07 ENCOUNTER — Other Ambulatory Visit: Payer: Self-pay

## 2020-09-07 ENCOUNTER — Ambulatory Visit (INDEPENDENT_AMBULATORY_CARE_PROVIDER_SITE_OTHER): Payer: Medicaid Other | Admitting: Women's Health

## 2020-09-07 VITALS — BP 103/61 | HR 83 | Wt 145.8 lb

## 2020-09-07 DIAGNOSIS — Z3480 Encounter for supervision of other normal pregnancy, unspecified trimester: Secondary | ICD-10-CM

## 2020-09-07 DIAGNOSIS — Z3A32 32 weeks gestation of pregnancy: Secondary | ICD-10-CM

## 2020-09-07 NOTE — Patient Instructions (Signed)
Maternity Assessment Unit (MAU)  The Maternity Assessment Unit (MAU) is located at the [facility] and [place] at [facility]. The address is: [address] [place], [place], [place] [address]. Please see map below for additional directions.    The Maternity Assessment Unit is designed to help you during your pregnancy, and for up to 6 weeks after delivery, with any pregnancy- or postpartum-related emergencies, if you think you are in labor, or if your water has broken. For example, if you experience nausea and vomiting, vaginal bleeding, severe abdominal or pelvic pain, elevated blood pressure or other problems related to your pregnancy or postpartum time, please come to the Maternity Assessment Unit for assistance.       Preterm Labor The normal length of a pregnancy is 39-41 weeks. Preterm labor is when labor starts before 37 completed weeks of pregnancy. Babies who are born prematurely and survive may not be fully developed and may be at an increased risk for long-term problems such as cerebral palsy, developmental delays, and vision andhearing problems. Babies who are born too early may have problems soon after birth. Premature babies may have problems regulating blood sugar, body temperature, heart rate, and breathing rate. These babies often have trouble with feeding. The risk ofhaving problems is highest for babies who are born before 39 weeks of pregnancy. What are the causes? The exact cause of this condition is not known. What increases the risk? You are more likely to have preterm labor if you have certain risk factors that relate to your medical history, problems with present and past pregnancies, andlifestyle factors. Medical history You have abnormalities of the uterus, including a short cervix. You have STIs (sexually transmitted infections) or other infections of the urinary tract and the vagina. You have chronic illnesses, such as blood clotting  problems, diabetes, or high blood pressure. You are overweight or underweight. Present and past pregnancies You have had preterm labor before. You are pregnant with twins or other multiples. You have been diagnosed with a condition in which the placenta covers your cervix (placenta previa). You waited less than 18 months between giving birth and becoming pregnant again. Your unborn baby has some abnormalities. You have vaginal bleeding during pregnancy. You became pregnant through in vitro fertilization (IVF). Lifestyle and environmental factors You use tobacco products or drink alcohol. You use drugs. You have stress and no social support. You experience domestic violence. You are exposed to certain chemicals or environmental pollutants. Other factors You are younger than age 94 or older than age 43. What are the signs or symptoms? Symptoms of this condition include: Cramps similar to those that can happen during a menstrual period. The cramps may happen with diarrhea. Pain in the abdomen or lower back. Regular contractions that may feel like tightening of the abdomen. A feeling of increased pressure in the pelvis. Increased watery or bloody mucus discharge from the vagina. Water breaking (ruptured amniotic sac). How is this diagnosed? This condition is diagnosed based on: Your medical history and a physical exam. A pelvic exam. An ultrasound. Monitoring your uterus for contractions. Other tests, including: A swab of the cervix to check for a chemical called fetal fibronectin. Urine tests. How is this treated? Treatment for this condition depends on the length of your pregnancy, your condition, and the health of your baby. Treatment may include: Taking medicines, such as: Hormone medicines. These may be given early in pregnancy to help support the pregnancy. Medicines to stop contractions. Medicines to  help mature the baby's lungs. These may be prescribed if the risk of  delivery is high. Medicines to help protect your baby from brain and nerve complications such as cerebral palsy. Bed rest. If the labor happens before 34 weeks of pregnancy, you may need to stay in the hospital. Delivery of the baby. Follow these instructions at home:  Do not use any products that contain nicotine or tobacco. These products include cigarettes, chewing tobacco, and vaping devices, such as e-cigarettes. If you need help quitting, ask your health care provider. Do not drink alcohol. Take over-the-counter and prescription medicines only as told by your health care provider. Rest as told by your health care provider. Return to your normal activities as told by your health care provider. Ask your health care provider what activities are safe for you. Keep all follow-up visits. This is important. How is this prevented? To increase your chance of having a full-term pregnancy: Do not use drugs or take medicines that have not been prescribed to you during your pregnancy. Talk with your health care provider before taking any herbal supplements, even if you have been taking them regularly. Make sure you gain a healthy amount of weight during your pregnancy. Watch for infection. If you think that you might have an infection, get it checked right away. Symptoms of infection may include: Fever. Abnormal vaginal discharge or discharge that smells bad. Pain or burning with urination. Needing to urinate urgently. Frequently urinating or passing small amounts of urine frequently. Blood in your urine or urine that smells bad or unusual. Where to find more information U.S. Department of Health and Cytogeneticist on Women's Health: http://hoffman.com/ [employer] of [employer]st[employer]ians and [employer]necologists: www.acog.org Centers for Disease Control and Prevention, Preterm Birth: FootballExhibition.com.br Contact a health care provider if: You think you are going into preterm labor. You have signs  or symptoms of preterm labor. You have symptoms of infection. Get help right away if: You are having regular, painful contractions every 5 minutes or less. Your water breaks. Summary Preterm labor is labor that starts before you reach 37 weeks of pregnancy. Delivering your baby early increases your baby's risk of developing long-term problems. You are more likely to have preterm labor if you have certain risk factors that relate to your medical history, problems with present and past pregnancies, and lifestyle factors. Keep all follow-up visits. This is important. Contact a health care provider if you have signs or symptoms of preterm labor. This information is not intended to replace advice given to you by your health care provider. Make sure you discuss any questions you have with your healthcare provider. Document Revised: 02/02/2020 Document Reviewed: 02/02/2020 Elsevier Patient Education  2022 [employer].

## 2020-09-07 NOTE — Progress Notes (Signed)
Pt reports fetal movement, denies pain.  

## 2020-09-07 NOTE — Progress Notes (Signed)
Subjective:  Janet Rodriguez is a 30 y.o. [phone] at [redacted]w[redacted]d being seen today for ongoing prenatal care.  She is currently monitored for the following issues for this low-risk pregnancy and has Supervision of normal pregnancy, antepartum and COVID-19 affecting pregnancy in third trimester on their problem list.  Patient reports no complaints.  Contractions: Not present. Vag. Bleeding: None.  Movement: Present. Denies leaking of fluid.   The following portions of the patient's history were reviewed and updated as appropriate: allergies, current medications, past family history, past medical history, past social history, past surgical history and problem list. Problem list updated.  Objective:   Vitals:   09/07/20 1537  BP: 103/61  Pulse: 83  Weight: 145 lb 12.8 oz (66.1 kg)    Fetal Status: Fetal Heart Rate (bpm): 152 Fundal Height: 32 cm Movement: Present     General:  Alert, oriented and cooperative. Patient is in no acute distress.  Skin: Skin is warm and dry. No rash noted.   Cardiovascular: Normal heart rate noted  Respiratory: Normal respiratory effort, no problems with respiration noted  Abdomen: Soft, gravid, appropriate for gestational age. Pain/Pressure: Absent     Pelvic: Vag. Bleeding: None     Cervical exam deferred        Extremities: Normal range of motion.  Edema: Trace  Mental Status: Normal mood and affect. Normal behavior. Normal judgment and thought content.   Urinalysis:      Assessment and Plan:  Pregnancy: [id number] at [redacted]w[redacted]d  1. Supervision of other normal pregnancy, antepartum  2. [redacted] weeks gestation of pregnancy  Preterm labor symptoms and general obstetric precautions including but not limited to vaginal bleeding, contractions, leaking of fluid and fetal movement were reviewed in detail with the patient. I discussed the assessment and treatment plan with the patient. The patient was provided an opportunity to ask questions and all were answered. The patient  agreed with the plan and demonstrated an understanding of the instructions. The patient was advised to call back or seek an in-person office evaluation/go to MAU at [facility] for any urgent or concerning symptoms. Please refer to After Visit Summary for other counseling recommendations.  Return in about 2 weeks (around 09/21/2020) for in-person LOB/APP OK.   Caitlyn Buchanan, Odie Sera, NP

## 2020-09-21 ENCOUNTER — Encounter: Payer: Medicaid Other | Admitting: Women's Health

## 2020-09-27 ENCOUNTER — Encounter: Payer: Medicaid Other | Admitting: Obstetrics

## 2020-10-03 ENCOUNTER — Encounter: Payer: Self-pay | Admitting: Obstetrics

## 2020-10-03 ENCOUNTER — Other Ambulatory Visit (HOSPITAL_COMMUNITY)
Admission: RE | Admit: 2020-10-03 | Discharge: 2020-10-03 | Disposition: A | Discharge status: Home / Self Care | Payer: Medicaid Other | Source: Ambulatory Visit | Attending: Women's Health | Admitting: Women's Health

## 2020-10-03 ENCOUNTER — Other Ambulatory Visit: Payer: Self-pay

## 2020-10-03 ENCOUNTER — Ambulatory Visit (INDEPENDENT_AMBULATORY_CARE_PROVIDER_SITE_OTHER): Payer: Medicaid Other | Admitting: Obstetrics

## 2020-10-03 VITALS — BP 121/70 | HR 76 | Wt 152.0 lb

## 2020-10-03 DIAGNOSIS — Z3480 Encounter for supervision of other normal pregnancy, unspecified trimester: Secondary | ICD-10-CM | POA: Insufficient documentation

## 2020-10-03 NOTE — Progress Notes (Signed)
Subjective:  Janet Rodriguez is a 30 y.o. [phone] at [redacted]w[redacted]d being seen today for ongoing prenatal care.  She is currently monitored for the following issues for this low-risk pregnancy and has Supervision of normal pregnancy, antepartum and COVID-19 affecting pregnancy in third trimester on their problem list.  Patient reports no complaints.  Contractions: Irregular. Vag. Bleeding: None.  Movement: Present. Denies leaking of fluid.   The following portions of the patient's history were reviewed and updated as appropriate: allergies, current medications, past family history, past medical history, past social history, past surgical history and problem list. Problem list updated.  Objective:   Vitals:   10/03/20 1133  BP: 121/70  Pulse: 76  Weight: 152 lb (68.9 kg)    Fetal Status: Fetal Heart Rate (bpm): 152   Movement: Present     General:  Alert, oriented and cooperative. Patient is in no acute distress.  Skin: Skin is warm and dry. No rash noted.   Cardiovascular: Normal heart rate noted  Respiratory: Normal respiratory effort, no problems with respiration noted  Abdomen: Soft, gravid, appropriate for gestational age. Pain/Pressure: Present     Pelvic:  Cervical exam deferred        Extremities: Normal range of motion.     Mental Status: Normal mood and affect. Normal behavior. Normal judgment and thought content.   Urinalysis:      Assessment and Plan:  Pregnancy: [id number] at [redacted]w[redacted]d  1. Supervision of other normal pregnancy, antepartum Rx: - Strep Gp B NAA - Cervicovaginal ancillary only( [facility])  Preterm labor symptoms and general obstetric precautions including but not limited to vaginal bleeding, contractions, leaking of fluid and fetal movement were reviewed in detail with the patient. Please refer to After Visit Summary for other counseling recommendations.   Return in about 1 week (around 10/10/2020) for ROB.   Brock Bad, MD  10/03/20

## 2020-10-04 LAB — CERVICOVAGINAL ANCILLARY ONLY
Bacterial Vaginitis (gardnerella): NEGATIVE
Candida Glabrata: NEGATIVE
Candida Vaginitis: NEGATIVE
Chlamydia: NEGATIVE
Comment: NEGATIVE
Comment: NEGATIVE
Comment: NEGATIVE
Comment: NEGATIVE
Comment: NEGATIVE
Comment: NORMAL
Neisseria Gonorrhea: NEGATIVE
Trichomonas: NEGATIVE

## 2020-10-05 LAB — STREP GP B NAA: Strep Gp B NAA: NEGATIVE

## 2020-10-10 ENCOUNTER — Ambulatory Visit (INDEPENDENT_AMBULATORY_CARE_PROVIDER_SITE_OTHER): Payer: Medicaid Other | Admitting: Advanced Practice Midwife

## 2020-10-10 ENCOUNTER — Other Ambulatory Visit: Payer: Self-pay

## 2020-10-10 VITALS — BP 102/67 | HR 82 | Wt 149.0 lb

## 2020-10-10 DIAGNOSIS — Z3A37 37 weeks gestation of pregnancy: Secondary | ICD-10-CM

## 2020-10-10 DIAGNOSIS — Z3480 Encounter for supervision of other normal pregnancy, unspecified trimester: Secondary | ICD-10-CM

## 2020-10-10 NOTE — Progress Notes (Signed)
   PRENATAL VISIT NOTE  Subjective:  Janet Rodriguez is a 30 y.o. [phone] at [redacted]w[redacted]d being seen today for ongoing prenatal care.  She is currently monitored for the following issues for this low-risk pregnancy and has Supervision of normal pregnancy, antepartum and COVID-19 affecting pregnancy in third trimester on their problem list.  Patient reports occasional contractions.  Contractions: Irregular. Vag. Bleeding: None.  Movement: Present. Denies leaking of fluid.   The following portions of the patient's history were reviewed and updated as appropriate: allergies, current medications, past family history, past medical history, past social history, past surgical history and problem list.   Objective:   Vitals:   10/10/20 1040  BP: 102/67  Pulse: 82  Weight: 149 lb (67.6 kg)    Fetal Status: Fetal Heart Rate (bpm): 136 Fundal Height: 37 cm Movement: Present  Presentation: Vertex  General:  Alert, oriented and cooperative. Patient is in no acute distress.  Skin: Skin is warm and dry. No rash noted.   Cardiovascular: Normal heart rate noted  Respiratory: Normal respiratory effort, no problems with respiration noted  Abdomen: Soft, gravid, appropriate for gestational age.  Pain/Pressure: Present     Pelvic: Cervical exam performed in the presence of a chaperone Dilation: 1 Effacement (%): 0 Station: -3  Extremities: Normal range of motion.  Edema: Trace  Mental Status: Normal mood and affect. Normal behavior. Normal judgment and thought content.   Assessment and Plan:  Pregnancy: [id number] at [redacted]w[redacted]d 1. Supervision of other normal pregnancy, antepartum --Anticipatory guidance about next visits/weeks of pregnancy given. --Next visit in 1 week --Labor readiness/labor precautions reviewed  2. [redacted] weeks gestation of pregnancy  Preterm labor symptoms and general obstetric precautions including but not limited to vaginal bleeding, contractions, leaking of fluid and fetal movement were  reviewed in detail with the patient. Please refer to After Visit Summary for other counseling recommendations.   Return in about 1 week (around 10/17/2020).  Future Appointments  Date Time Provider Department Center  10/19/2020 10:55 AM Marny Lowenstein, PA-C [facility] None    Sharen Counter, CNM

## 2020-10-10 NOTE — Progress Notes (Signed)
+   Fetal movement. Pt c/o pelvic pain and pressure.

## 2020-10-10 NOTE — Patient Instructions (Signed)
Things to Try After 37 weeks to Encourage Labor/Get Ready for Labor:    Try the Miles Circuit at www.milescircuit.com daily to improve baby's position and encourage the onset of labor.  Walk a little and rest a little every day.  Change positions often.  Cervical Ripening: May try one or both Red Raspberry Leaf capsules or tea:  two 300mg or 400mg tablets with each meal, 2-3 times a day, or 1-3 cups of tea daily  Potential Side Effects Of Raspberry Leaf:  Most women do not experience any side effects from drinking raspberry leaf tea. However, nausea and loose stools are possible   Evening Primrose Oil capsules: take 1 capsule by mouth and place one capsule in the vagina every night.    Some of the potential side effects:  Upset stomach  Loose stools or diarrhea  Headaches  Nausea  Sex can also help the cervix ripen and encourage labor onset.    Labor Precautions Reasons to come to MAU at [facility]:  1.  Contractions are  5 minutes apart or less, each last 1 minute, these have been going on for 1-2 hours, and you cannot walk or talk during them 2.  You have a large gush of fluid, or a trickle of fluid that will not stop and you have to wear a pad 3.  You have bleeding that is bright red, heavier than spotting--like menstrual bleeding (spotting can be normal in early labor or after a check of your cervix) 4.  You do not feel the baby moving like he/she normally does  

## 2020-10-19 ENCOUNTER — Other Ambulatory Visit: Payer: Self-pay

## 2020-10-19 ENCOUNTER — Ambulatory Visit (INDEPENDENT_AMBULATORY_CARE_PROVIDER_SITE_OTHER): Payer: Medicaid Other | Admitting: Medical

## 2020-10-19 VITALS — BP 107/69 | HR 69 | Wt 158.0 lb

## 2020-10-19 DIAGNOSIS — Z3A38 38 weeks gestation of pregnancy: Secondary | ICD-10-CM

## 2020-10-19 DIAGNOSIS — Z3480 Encounter for supervision of other normal pregnancy, unspecified trimester: Secondary | ICD-10-CM

## 2020-10-19 NOTE — Progress Notes (Signed)
ROB 38.4 wks Report feet hurt/ swelling 10lb weight gain Denies HA, blurry vision, dizziness, RUQ abd pain. Reports bad heartburn.

## 2020-10-19 NOTE — Patient Instructions (Signed)

## 2020-10-19 NOTE — Progress Notes (Signed)
   PRENATAL VISIT NOTE  Subjective:  Janet Rodriguez is a 30 y.o. [id number] at [redacted]w[redacted]d being seen today for ongoing prenatal care.  She is currently monitored for the following issues for this low-risk pregnancy and has Supervision of normal pregnancy, antepartum and COVID-19 affecting pregnancy in third trimester on their problem list.  Patient reports occasional contractions.  Contractions: Irregular. Vag. Bleeding: None.  Movement: Present. Denies leaking of fluid.   The following portions of the patient's history were reviewed and updated as appropriate: allergies, current medications, past family history, past medical history, past social history, past surgical history and problem list.   Objective:   Vitals:   10/19/20 1116  BP: 107/69  Pulse: 69  Weight: 158 lb (71.7 kg)    Fetal Status: Fetal Heart Rate (bpm): 152   Movement: Present  Presentation: Vertex  General:  Alert, oriented and cooperative. Patient is in no acute distress.  Skin: Skin is warm and dry. No rash noted.   Cardiovascular: Normal heart rate noted  Respiratory: Normal respiratory effort, no problems with respiration noted  Abdomen: Soft, gravid, appropriate for gestational age.  Pain/Pressure: Present     Pelvic: Cervical exam performed in the presence of a chaperone Dilation: 3 Effacement (%): 20 Station: -2  Extremities: Normal range of motion.  Edema: Trace  Mental Status: Normal mood and affect. Normal behavior. Normal judgment and thought content.   Assessment and Plan:  Pregnancy: [id number] at [redacted]w[redacted]d 1. Supervision of other normal pregnancy, antepartum - GBS negative discussed  - Unsure about MOC, possible POPs, no LARC  2. [redacted] weeks gestation of pregnancy  Term labor symptoms and general obstetric precautions including but not limited to vaginal bleeding, contractions, leaking of fluid and fetal movement were reviewed in detail with the patient. Please refer to After Visit Summary for other counseling  recommendations.   No follow-ups on file.  No future appointments.  Vonzella Nipple, PA-C

## 2020-10-23 ENCOUNTER — Inpatient Hospital Stay (EMERGENCY_DEPARTMENT_HOSPITAL)
Admission: AD | Admit: 2020-10-23 | Discharge: 2020-10-24 | Disposition: A | Discharge status: Home / Self Care | Payer: Medicaid Other | Source: Home / Self Care | Attending: Obstetrics & Gynecology | Admitting: Obstetrics & Gynecology

## 2020-10-23 ENCOUNTER — Other Ambulatory Visit: Payer: Self-pay

## 2020-10-23 DIAGNOSIS — Z0371 Encounter for suspected problem with amniotic cavity and membrane ruled out: Secondary | ICD-10-CM | POA: Insufficient documentation

## 2020-10-23 DIAGNOSIS — Z3A39 39 weeks gestation of pregnancy: Secondary | ICD-10-CM

## 2020-10-23 DIAGNOSIS — O471 False labor at or after 37 completed weeks of gestation: Secondary | ICD-10-CM | POA: Insufficient documentation

## 2020-10-23 DIAGNOSIS — Z3689 Encounter for other specified antenatal screening: Secondary | ICD-10-CM

## 2020-10-23 DIAGNOSIS — O479 False labor, unspecified: Secondary | ICD-10-CM

## 2020-10-24 ENCOUNTER — Other Ambulatory Visit: Payer: Self-pay

## 2020-10-24 ENCOUNTER — Encounter (HOSPITAL_COMMUNITY): Payer: Self-pay | Admitting: Obstetrics & Gynecology

## 2020-10-24 DIAGNOSIS — Z3A39 39 weeks gestation of pregnancy: Secondary | ICD-10-CM

## 2020-10-24 DIAGNOSIS — Z0371 Encounter for suspected problem with amniotic cavity and membrane ruled out: Secondary | ICD-10-CM | POA: Diagnosis not present

## 2020-10-24 LAB — CBC WITH DIFFERENTIAL/PLATELET
Abs Immature Granulocytes: 0.12 10*3/uL — ABNORMAL HIGH (ref 0.00–0.07)
Basophils Absolute: 0 10*3/uL (ref 0.0–0.1)
Basophils Relative: 0 %
Eosinophils Absolute: 0.1 10*3/uL (ref 0.0–0.5)
Eosinophils Relative: 1 %
HCT: 39.1 % (ref 36.0–46.0)
Hemoglobin: 13.1 g/dL (ref 12.0–15.0)
Immature Granulocytes: 1 %
Lymphocytes Relative: 31 %
Lymphs Abs: 2.7 10*3/uL (ref 0.7–4.0)
MCH: 32 pg (ref 26.0–34.0)
MCHC: 33.5 g/dL (ref 30.0–36.0)
MCV: 95.4 fL (ref 80.0–100.0)
Monocytes Absolute: 0.7 10*3/uL (ref 0.1–1.0)
Monocytes Relative: 8 %
Neutro Abs: 5.2 10*3/uL (ref 1.7–7.7)
Neutrophils Relative %: 59 %
Platelets: 261 10*3/uL (ref 150–400)
RBC: 4.1 MIL/uL (ref 3.87–5.11)
RDW: 14.6 % (ref 11.5–15.5)
WBC: 8.8 10*3/uL (ref 4.0–10.5)
nRBC: 0 % (ref 0.0–0.2)

## 2020-10-24 LAB — TYPE AND SCREEN
ABO/RH(D): A POS
Antibody Screen: NEGATIVE

## 2020-10-24 LAB — RPR: RPR Ser Ql: NONREACTIVE

## 2020-10-24 LAB — POCT FERN TEST: POCT Fern Test: NEGATIVE

## 2020-10-24 MED ORDER — LACTATED RINGERS IV BOLUS
1000.0000 mL | Freq: Once | INTRAVENOUS | Status: AC
  Administered 2020-10-24: 1000 mL via INTRAVENOUS

## 2020-10-24 NOTE — MAU Provider Note (Signed)
Event Date/Time   First Provider Initiated Contact with Patient 10/24/20 0035     S: Janet Rodriguez is a 30 y.o. [id number] at [redacted]w[redacted]d  who presents to MAU today complaining of leaking of fluid since yesterday.  She denies vaginal bleeding. She endorses contractions. She reports normal fetal movement.    O: BP 115/69 (BP Location: Right Arm)   Pulse 83   Temp 98.3 F (36.8 C) (Oral)   Resp 16   Ht 5' 5.5" (1.664 m)   Wt 70.8 kg   LMP 12/18/2019 (Exact Date)   SpO2 100%   BMI 25.56 kg/m  GENERAL: Well-developed, well-nourished female in no acute distress.  HEAD: Normocephalic, atraumatic.  CHEST: Normal effort of breathing, regular heart rate ABDOMEN: Soft, nontender, gravid PELVIC: Normal external female genitalia. Vagina is pink and rugated. Cervix with normal contour, no lesions. Normal discharge.  Negative pooling.  Small- moderate amount of mucoid discharge.   Cervical exam:  Dilation: 3 Effacement (%): Thick Station: -3 Exam by:: San Jetty RN   Fetal Monitoring: Initially tracing with minimal variability. LR bolus given and patient placed on left lateral side.  Baseline: 130 bpm Variability: Moderate Accelerations: 15x15 Decelerations: None Contractions: Occasional   Results for orders placed or performed during the hospital encounter of 10/23/20 (from the past 24 hour(s))  POCT fern test     Status: Normal   Collection Time: 10/24/20 12:45 AM  Result Value Ref Range   POCT Fern Test Negative = intact amniotic membranes      A: SIUP at [redacted]w[redacted]d  Membranes intact  P:   RN to continue as labor check.  NST reactive, Category 1  Janet Rodriguez, Janet Rutherford, NP 10/26/2020 1:16 PM

## 2020-10-24 NOTE — MAU Note (Signed)
Gush in shower. Contractions every 3-5 min apart since 8:30 pm.  Was 3 cm in office. Baby moving well. No bleeding.

## 2020-10-25 ENCOUNTER — Inpatient Hospital Stay (HOSPITAL_COMMUNITY)
Admission: AD | Admit: 2020-10-25 | Discharge: 2020-10-27 | DRG: 807 | Disposition: A | Discharge status: Home / Self Care | Payer: Medicaid Other | Attending: Obstetrics & Gynecology | Admitting: Obstetrics & Gynecology

## 2020-10-25 ENCOUNTER — Encounter (HOSPITAL_COMMUNITY): Payer: Self-pay | Admitting: Obstetrics & Gynecology

## 2020-10-25 ENCOUNTER — Ambulatory Visit (INDEPENDENT_AMBULATORY_CARE_PROVIDER_SITE_OTHER): Payer: Medicaid Other | Admitting: Obstetrics

## 2020-10-25 ENCOUNTER — Encounter: Payer: Self-pay | Admitting: Obstetrics

## 2020-10-25 ENCOUNTER — Inpatient Hospital Stay (HOSPITAL_COMMUNITY): Payer: Medicaid Other | Admitting: Anesthesiology

## 2020-10-25 ENCOUNTER — Other Ambulatory Visit: Payer: Self-pay

## 2020-10-25 VITALS — BP 104/69 | HR 65 | Wt 152.0 lb

## 2020-10-25 DIAGNOSIS — O4202 Full-term premature rupture of membranes, onset of labor within 24 hours of rupture: Secondary | ICD-10-CM | POA: Diagnosis not present

## 2020-10-25 DIAGNOSIS — Z0371 Encounter for suspected problem with amniotic cavity and membrane ruled out: Secondary | ICD-10-CM | POA: Diagnosis not present

## 2020-10-25 DIAGNOSIS — Z3A39 39 weeks gestation of pregnancy: Secondary | ICD-10-CM | POA: Diagnosis not present

## 2020-10-25 DIAGNOSIS — Z3480 Encounter for supervision of other normal pregnancy, unspecified trimester: Secondary | ICD-10-CM

## 2020-10-25 DIAGNOSIS — O98513 Other viral diseases complicating pregnancy, third trimester: Secondary | ICD-10-CM | POA: Diagnosis present

## 2020-10-25 DIAGNOSIS — Z8616 Personal history of COVID-19: Secondary | ICD-10-CM

## 2020-10-25 DIAGNOSIS — Z87891 Personal history of nicotine dependence: Secondary | ICD-10-CM

## 2020-10-25 DIAGNOSIS — O26893 Other specified pregnancy related conditions, third trimester: Secondary | ICD-10-CM | POA: Diagnosis not present

## 2020-10-25 DIAGNOSIS — U071 COVID-19: Secondary | ICD-10-CM | POA: Diagnosis present

## 2020-10-25 LAB — CBC
HCT: 38.4 % (ref 36.0–46.0)
Hemoglobin: 12.6 g/dL (ref 12.0–15.0)
MCH: 31.2 pg (ref 26.0–34.0)
MCHC: 32.8 g/dL (ref 30.0–36.0)
MCV: 95 fL (ref 80.0–100.0)
Platelets: 266 10*3/uL (ref 150–400)
RBC: 4.04 MIL/uL (ref 3.87–5.11)
RDW: 14.6 % (ref 11.5–15.5)
WBC: 10.7 10*3/uL — ABNORMAL HIGH (ref 4.0–10.5)
nRBC: 0 % (ref 0.0–0.2)

## 2020-10-25 LAB — TYPE AND SCREEN
ABO/RH(D): A POS
Antibody Screen: NEGATIVE

## 2020-10-25 LAB — POCT FERN TEST: POCT Fern Test: POSITIVE

## 2020-10-25 MED ORDER — OXYCODONE-ACETAMINOPHEN 5-325 MG PO TABS
1.0000 | ORAL_TABLET | ORAL | Status: DC | PRN
Start: 2020-10-25 — End: 2020-10-26

## 2020-10-25 MED ORDER — OXYTOCIN BOLUS FROM INFUSION
333.0000 mL | Freq: Once | INTRAVENOUS | Status: AC
  Administered 2020-10-25: 333 mL via INTRAVENOUS

## 2020-10-25 MED ORDER — ACETAMINOPHEN 325 MG PO TABS
650.0000 mg | ORAL_TABLET | ORAL | Status: DC | PRN
Start: 2020-10-25 — End: 2020-10-26

## 2020-10-25 MED ORDER — SOD CITRATE-CITRIC ACID 500-334 MG/5ML PO SOLN: 30.0000 mL | ORAL | Status: DC | PRN

## 2020-10-25 MED ORDER — OXYCODONE-ACETAMINOPHEN 5-325 MG PO TABS
2.0000 | ORAL_TABLET | ORAL | Status: DC | PRN
Start: 2020-10-25 — End: 2020-10-26

## 2020-10-25 MED ORDER — ONDANSETRON HCL 4 MG/2ML IJ SOLN: 4.0000 mg | Freq: Four times a day (QID) | INTRAMUSCULAR | Status: DC | PRN

## 2020-10-25 MED ORDER — FENTANYL CITRATE (PF) 100 MCG/2ML IJ SOLN
50.0000 ug | INTRAMUSCULAR | Status: DC | PRN
  Administered 2020-10-25: 100 ug via INTRAVENOUS
  Filled 2020-10-25: qty 2

## 2020-10-25 MED ORDER — LIDOCAINE HCL (PF) 1 % IJ SOLN: 30.0000 mL | INTRAMUSCULAR | Status: DC | PRN

## 2020-10-25 MED ORDER — PHENYLEPHRINE 40 MCG/ML (10ML) SYRINGE FOR IV PUSH (FOR BLOOD PRESSURE SUPPORT)
80.0000 ug | PREFILLED_SYRINGE | INTRAVENOUS | Status: DC | PRN
  Administered 2020-10-25: 80 ug via INTRAVENOUS

## 2020-10-25 MED ORDER — FENTANYL-BUPIVACAINE-NACL 0.5-0.125-0.9 MG/250ML-% EP SOLN
12.0000 mL/h | EPIDURAL | Status: DC | PRN
Start: 2020-10-25 — End: 2020-10-26
  Administered 2020-10-25: 12 mL/h via EPIDURAL
  Filled 2020-10-25: qty 250

## 2020-10-25 MED ORDER — LACTATED RINGERS IV SOLN: 500.0000 mL | INTRAVENOUS | Status: DC | PRN

## 2020-10-25 MED ORDER — DIPHENHYDRAMINE HCL 50 MG/ML IJ SOLN: 12.5000 mg | INTRAMUSCULAR | Status: DC | PRN

## 2020-10-25 MED ORDER — LACTATED RINGERS IV SOLN: INTRAVENOUS | Status: DC

## 2020-10-25 MED ORDER — EPHEDRINE 5 MG/ML INJ: 10.0000 mg | INTRAVENOUS | Status: DC | PRN

## 2020-10-25 MED ORDER — LIDOCAINE-EPINEPHRINE (PF) 2 %-1:200000 IJ SOLN
INTRAMUSCULAR | Status: DC | PRN
  Administered 2020-10-25: 5 mL via EPIDURAL

## 2020-10-25 MED ORDER — LACTATED RINGERS IV SOLN: 500.0000 mL | Freq: Once | INTRAVENOUS | Status: DC

## 2020-10-25 MED ORDER — PHENYLEPHRINE 40 MCG/ML (10ML) SYRINGE FOR IV PUSH (FOR BLOOD PRESSURE SUPPORT)
80.0000 ug | PREFILLED_SYRINGE | INTRAVENOUS | Status: DC | PRN
  Filled 2020-10-25: qty 10

## 2020-10-25 MED ORDER — OXYTOCIN-SODIUM CHLORIDE 30-0.9 UT/500ML-% IV SOLN
2.5000 [IU]/h | INTRAVENOUS | Status: DC
  Administered 2020-10-25: 2.5 [IU]/h via INTRAVENOUS
  Filled 2020-10-25: qty 500

## 2020-10-25 NOTE — Plan of Care (Signed)
Pt progressing rapidly in labor

## 2020-10-25 NOTE — Discharge Summary (Signed)
Postpartum Discharge Summary    Patient Name: Janet Rodriguez DOB: October 19, 1990 MRN: [id number]  Date of admission: 10/25/2020 Delivery date:10/25/2020  Delivering provider: Patriciaann Clan  Date of discharge: 10/27/2020  Admitting diagnosis: Indication for care in labor and delivery, antepartum [O75.9] Intrauterine pregnancy: [redacted]w[redacted]d    Secondary diagnosis:  Active Problems:   COVID-19 affecting pregnancy in third trimester   Indication for care in labor and delivery, antepartum  Additional problems: None     Discharge diagnosis: Term Pregnancy Delivered                                              Post partum procedures: None Augmentation: N/A Complications: None  Hospital course: Onset of Labor With Vaginal Delivery      30y.o. yo [id number] 363w3das admitted in Active Labor on 10/25/2020. Patient had an uncomplicated labor course as follows:  Membrane Rupture Time/Date: 9:10 PM ,10/25/2020   Delivery Method:Vaginal, Spontaneous  Episiotomy: None  Lacerations:  None  Patient had an uncomplicated postpartum course.  She is ambulating, tolerating a regular diet, passing flatus, and urinating well. Patient is discharged home in stable condition on 10/27/20.  Newborn Data: Birth date:10/25/2020  Birth time:11:36 PM  Gender:Female  Living status:Living  Apgars:9 ,9  Weight:2240 g   Magnesium Sulfate received: No BMZ received: No Rhophylac:No MMR:No T-DaP:Given prenatally Flu: N/A Transfusion:No  Physical exam  Vitals:   10/26/20 1430 10/26/20 2055 10/26/20 2105 10/27/20 0539  BP: 107/74  97/60 115/63  Pulse: 63  67 60  Resp: _0 Temp: 98.2 F (36.8 C) 97.9 F (36.6 C) 97.9 F (36.6 C) 97.9 F (36.6 C)  TempSrc: Oral  Oral Oral  SpO2:   100% 100%   General: alert, cooperative, and no distress Lochia: appropriate Uterine Fundus: firm Incision: N/A DVT Evaluation: No evidence of DVT seen on physical exam. No significant calf/ankle edema. Labs: Lab  Results  Component Value Date   WBC 15.4 (H) 10/26/2020   HGB 12.8 10/26/2020   HCT 38.0 10/26/2020   MCV 93.6 10/26/2020   PLT 254 10/26/2020   CMP Latest Ref Rng & Units 11/30/2019  Glucose 70 - 99 mg/dL 96  BUN 6 - 20 mg/dL 6  Creatinine 0.44 - 1.00 mg/dL 0.77  Sodium 135 - 145 mmol/L 142  Potassium 3.5 - 5.1 mmol/L 3.5  Chloride 98 - 111 mmol/L 105  CO2 22 - 32 mmol/L 29  Calcium 8.9 - 10.3 mg/dL 9.4  Total Protein 6.5 - 8.1 g/dL 7.0  Total Bilirubin 0.3 - 1.2 mg/dL 0.6  Alkaline Phos 38 - 126 U/L 36(L)  AST 15 - 41 U/L 16  ALT 0 - 44 U/L 8   Edinburgh Score: Edinburgh Postnatal Depression Scale Screening Tool 10/26/2020  I have been able to laugh and see the funny side of things. 0  I have looked forward with enjoyment to things. 0  I have blamed myself unnecessarily when things went wrong. 0  I have been anxious or worried for no good reason. 0  I have felt scared or panicky for no good reason. 0  Things have been getting on top of me. 0  I have been so unhappy that I have had difficulty sleeping. 0  I have felt sad or miserable. 0  I have been so unhappy  that I have been crying. 0  The thought of harming myself has occurred to me. 0  Edinburgh Postnatal Depression Scale Total 0     After visit meds:  Allergies as of 10/27/2020   No Known Allergies      Medication List     STOP taking these medications    Blood Pressure Monitor Kit       TAKE these medications    acetaminophen 325 MG tablet Commonly known as: Tylenol Take 2 tablets (650 mg total) by mouth every 4 (four) hours as needed (for pain scale < 4).   ibuprofen 600 MG tablet Commonly known as: ADVIL Take 1 tablet (600 mg total) by mouth every 6 (six) hours.   Prenatal Vitamin 27-0.8 MG Tabs Take 1 tablet by mouth daily.   Slynd 4 MG Tabs Generic drug: Drospirenone Take 1 tablet by mouth daily. Start taking on: November 10, 2020         Discharge home in stable  condition Infant Feeding: Bottle and Breast Infant Disposition:home with mother Discharge instruction: per After Visit Summary and Postpartum booklet. Activity: Advance as tolerated. Pelvic rest for 6 weeks.  Diet: routine diet Future Appointments: Future Appointments  Date Time Provider [place]  11/25/2020 10:15 AM Shelly Bombard, MD [place] None   Follow up Visit:  Message sent to [facility] by Dr Higinio Plan on 10/25/2020:   Please schedule this patient for a In person postpartum visit in 4 weeks with the following provider: Any provider. Additional Postpartum F/U: None   Low risk pregnancy complicated by:  None Delivery mode:  Vaginal, Spontaneous  Anticipated Birth Control:  POPs   10/27/2020 Patriciaann Clan, DO

## 2020-10-25 NOTE — Anesthesia Preprocedure Evaluation (Signed)
Anesthesia Evaluation  Patient identified by MRN, date of birth, ID band Patient awake    Reviewed: Allergy & Precautions, NPO status , Patient's Chart, lab work & pertinent test results  Airway Mallampati: II  TM Distance: >3 FB Neck ROM: Full    Dental no notable dental hx.    Pulmonary former smoker,    Pulmonary exam normal breath sounds clear to auscultation       Cardiovascular negative cardio ROS Normal cardiovascular exam Rhythm:Regular Rate:Normal     Neuro/Psych  Headaches, negative psych ROS   GI/Hepatic negative GI ROS, Neg liver ROS,   Endo/Other  negative endocrine ROS  Renal/GU negative Renal ROS  negative genitourinary   Musculoskeletal negative musculoskeletal ROS (+)   Abdominal   Peds  Hematology negative hematology ROS (+)   Anesthesia Other Findings Presented with SROM  Reproductive/Obstetrics (+) Pregnancy                             Anesthesia Physical Anesthesia Plan  ASA: 2  Anesthesia Plan: Epidural   Post-op Pain Management:    Induction:   PONV Risk Score and Plan: Treatment may vary due to age or medical condition  Airway Management Planned: Natural Airway  Additional Equipment:   Intra-op Plan:   Post-operative Plan:   Informed Consent: I have reviewed the patients History and Physical, chart, labs and discussed the procedure including the risks, benefits and alternatives for the proposed anesthesia with the patient or authorized representative who has indicated his/her understanding and acceptance.       Plan Discussed with: Anesthesiologist  Anesthesia Plan Comments: (Patient identified. Risks, benefits, options discussed with patient including but not limited to bleeding, infection, nerve damage, paralysis, failed block, incomplete pain control, headache, blood pressure changes, nausea, vomiting, reactions to medication, itching, and post  partum back pain. Confirmed with bedside nurse the patient's most recent platelet count. Confirmed with the patient that they are not taking any anticoagulation, have any bleeding history or any family history of bleeding disorders. Patient expressed understanding and wishes to proceed. All questions were answered. )        Anesthesia Quick Evaluation

## 2020-10-25 NOTE — Anesthesia Procedure Notes (Signed)
Epidural Patient location during procedure: OB Start time: 10/25/2020 8:25 PM End time: 10/25/2020 8:35 PM  Staffing Anesthesiologist: Elmer Picker, MD Performed: anesthesiologist   Preanesthetic Checklist Completed: patient identified, IV checked, risks and benefits discussed, monitors and equipment checked, pre-op evaluation and timeout performed  Epidural Patient position: sitting Prep: DuraPrep and site prepped and draped Patient monitoring: continuous pulse ox, blood pressure, heart rate and cardiac monitor Approach: midline Location: L3-L4 Injection technique: LOR air  Needle:  Needle type: Tuohy  Needle gauge: 17 G Needle length: 9 cm Needle insertion depth: 4.5 cm Catheter type: closed end flexible Catheter size: 19 Gauge Catheter at skin depth: 10 cm Test dose: negative  Assessment Sensory level: T8 Events: blood not aspirated, injection not painful, no injection resistance, no paresthesia and negative IV test  Additional Notes Patient identified. Risks/Benefits/Options discussed with patient including but not limited to bleeding, infection, nerve damage, paralysis, failed block, incomplete pain control, headache, blood pressure changes, nausea, vomiting, reactions to medication both or allergic, itching and postpartum back pain. Confirmed with bedside nurse the patient's most recent platelet count. Confirmed with patient that they are not currently taking any anticoagulation, have any bleeding history or any family history of bleeding disorders. Patient expressed understanding and wished to proceed. All questions were answered. Sterile technique was used throughout the entire procedure. Please see nursing notes for vital signs. Test dose was given through epidural catheter and negative prior to continuing to dose epidural or start infusion. Warning signs of high block given to the patient including shortness of breath, tingling/numbness in hands, complete motor block,  or any concerning symptoms with instructions to call for help. Patient was given instructions on fall risk and not to get out of bed. All questions and concerns addressed with instructions to call with any issues or inadequate analgesia.  Reason for block:procedure for pain

## 2020-10-25 NOTE — H&P (Addendum)
OBSTETRIC ADMISSION HISTORY AND PHYSICAL  Janet Rodriguez is a 30 y.o. female [phone] with IUP at 50w3dby 13wk UKoreapresenting for SOL, SROM. She reports +FMs, No LOF, no VB, no blurry vision, headaches or peripheral edema, and RUQ pain.  She plans on breast and bottle feeding. She request POPs for birth control. [id number]  Estimated Date of Delivery: 10/29/20  Sono:    _0 , CWD, normal anatomy, cephalic presentation, [id number] EFW   Prenatal History/Complications: None  Past Medical History: Past Medical History:  Diagnosis Date   BV (bacterial vaginosis)    Headache(784.0)    History of pelvic fracture    Infection     Past Surgical History: Past Surgical History:  Procedure Laterality Date   ROOT CANAL      Obstetrical History: OB History     Gravida  5   Para  2   Term  2   Preterm      AB  2   Living  2      SAB  2   IAB      Ectopic      Multiple      Live Births  2           Social History Social History   Socioeconomic History   Marital status: Significant Other    Spouse name: Not on file   Number of children: Not on file   Years of education: Not on file   Highest education level: Not on file  Occupational History   Not on file  Tobacco Use   Smoking status: Former    Packs/day: 0.25    Years: 10.00    Pack years: 2.50    Types: Cigarettes    Quit date: 10/28/2011    Years since quitting: 9.0   Smokeless tobacco: Never   Tobacco comments:    NO longer smoking   Vaping Use   Vaping Use: Never used  Substance and Sexual Activity   Alcohol use: Not Currently    Comment: occ   Drug use: No   Sexual activity: Yes    Partners: Male    Birth control/protection: None  Other Topics Concern   Not on file  Social History Narrative   Not on file   Social Determinants of Health   Financial Resource Strain: Not on file  Food Insecurity: Not on file   Transportation Needs: Not on file  Physical Activity: Not on file  Stress: Not on file  Social Connections: Not on file    Family History: Family History  Problem Relation Age of Onset   Cancer Maternal Grandfather    Cancer Paternal Gr67  Healthy Mother    Healthy Father     Allergies: No Known Allergies  Medications Prior to Admission  Medication Sig Dispense Refill Last Dose   Prenatal Vit-Fe Fumarate-FA (PRENATAL VITAMIN) 27-0.8 MG TABS Take 1 tablet by mouth daily. 90 tablet 2 10/25/2020 at 1000   Blood Pressure Monitor KIT 1 Device by Does not apply route once a week. To be monitored Regularly at home. 1 kit 0      Review of Systems   All systems reviewed and negative except as stated in HPI  Blood pressure 106/60, pulse 74, temperature (!) 97.3 F (36.3 C), temperature source Oral, resp. rate 18, last menstrual period 12/18/2019, SpO2 100 %, unknown if currently breastfeeding.  General appearance: alert, cooperative, and no distress Lungs: normal WOB on RA Heart: regular rate and rhythm Abdomen: soft, non-tender; gravid uterus Extremities: no sign of DVT Presentation: cephalic Fetal monitoringBaseline: 130 bpm, Variability: Good {> 6 bpm), Accelerations: Reactive, and Decelerations: Absent Uterine activityContracting spontaneously every 2-3 minutes Dilation: 10 Effacement (%): 90 Station: Plus 2 Exam by:: Dr. Higinio Plan   Prenatal labs: ABO, Rh: --/--/A POS (09/13 2124) Antibody: NEG (09/13 2124) Rubella: 18.30 (02/16 1019) RPR: NON REACTIVE (09/12 0052)  HBsAg: Negative (02/16 1019)  HIV: Non Reactive (06/08 1028)  GBS: Negative/-- (08/22 0135)  1 hr Glucola WNL Genetic screening  Low Risk Anatomy US Intracardiac echogenic focus in LV of fetal heart  Prenatal Transfer Tool  Maternal Diabetes: No Genetic Screening: Normal Maternal Ultrasounds/Referrals: Isolated EIF (echogenic intracardiac focus) Fetal Ultrasounds or other Referrals:   None Maternal Substance Abuse:  No Significant Maternal Medications:  None Significant Maternal Lab Results: None  Results for orders placed or performed during the hospital encounter of 10/25/20 (from the past 24 hour(s))  Type and screen [place]   Collection Time: 10/25/20  9:24 PM  Result Value Ref Range   ABO/RH(D) A POS    Antibody Screen NEG    Sample Expiration      10/28/2020,2359 Performed at [facility], [place] [address] [place], [place] [address]   CBC   Collection Time: 10/25/20  9:39 PM  Result Value Ref Range   WBC 10.7 (H) 4.0 - 10.5 K/uL   RBC 4.04 3.87 - 5.11 MIL/uL   Hemoglobin 12.6 12.0 - 15.0 g/dL   HCT 38.4 36.0 - 46.0 %   MCV 95.0 80.0 - 100.0 fL   MCH 31.2 26.0 - 34.0 pg   MCHC 32.8 30.0 - 36.0 g/dL   RDW 14.6 11.5 - 15.5 %   Platelets 266 150 - 400 K/uL   nRBC 0.0 0.0 - 0.2 %  Fern Test   Collection Time: 10/25/20  9:52 PM  Result Value Ref Range   POCT Fern Test Positive = ruptured amniotic membanes     Patient Active Problem List   Diagnosis Date Noted   Indication for care in labor and delivery, antepartum 10/25/2020   COVID-19 affecting pregnancy in third trimester 08/10/2020   Supervision of normal pregnancy, antepartum 03/30/2020    Assessment/Plan:  [name] is a 30 y.o. [id number] at 46w3dhere for SOL/SROM  #Labor:Progressing spontaneously, had SROM with clear fluid in the MAU. Will continue expectant management.  #Pain: PRN, asking for IV fentanyl now. Planning epidural eventually.  #FWB: Cat I  #ID:  GBS neg #MOF: Both #MOC: POPs #Circ:  Yes  BPearla Dubonnet MD Family Medicine PGY-1  10/25/2020  GME ATTESTATION:  I saw and evaluated the patient. I agree with the findings and the plan of care as documented in the resident's note.  SDarrelyn Hillock DO OB Fellow, [place] [place]9/14/2022 2:02 AM

## 2020-10-25 NOTE — Addendum Note (Signed)
Addended by: Coral Ceo A on: 10/25/2020 12:25 PM   Modules accepted: Orders, SmartSet

## 2020-10-25 NOTE — Progress Notes (Signed)
Pt reports fetal movement with irregular contractions. 

## 2020-10-25 NOTE — MAU Note (Signed)
Cntrx  back to backsince 1800. Was 3cm at office. Membranes were swept. Some vag bleeding. Denies LOF. +FM

## 2020-10-25 NOTE — Progress Notes (Signed)
Subjective:  Janet Rodriguez is a 30 y.o. [phone] at [redacted]w[redacted]d being seen today for ongoing prenatal care.  She is currently monitored for the following issues for this low-risk pregnancy and has Supervision of normal pregnancy, antepartum and COVID-19 affecting pregnancy in third trimester on their problem list.  Patient reports occasional contractions.  Contractions: Irregular. Vag. Bleeding: None.  Movement: Present. Denies leaking of fluid.   The following portions of the patient's history were reviewed and updated as appropriate: allergies, current medications, past family history, past medical history, past social history, past surgical history and problem list. Problem list updated.  Objective:   Vitals:   10/25/20 1054  BP: 104/69  Pulse: 65  Weight: 152 lb (68.9 kg)    Fetal Status:     Movement: Present     General:  Alert, oriented and cooperative. Patient is in no acute distress.  Skin: Skin is warm and dry. No rash noted.   Cardiovascular: Normal heart rate noted  Respiratory: Normal respiratory effort, no problems with respiration noted  Abdomen: Soft, gravid, appropriate for gestational age. Pain/Pressure: Present     Pelvic:  Cvx:  3 cm / 50% / -3 / Vtx      Extremities: Normal range of motion.  Edema: Trace  Mental Status: Normal mood and affect. Normal behavior. Normal judgment and thought content.   Urinalysis:      Assessment and Plan:  Pregnancy: [id number] at [redacted]w[redacted]d  1. Supervision of other normal pregnancy, antepartum  2. Encounter for suspected premature rupture of amniotic membranes, with rupture of membranes not found - pooling of fluid in vaginal vault:  Nitrazine and fern negative    Term labor symptoms and general obstetric precautions including but not limited to vaginal bleeding, contractions, leaking of fluid and fetal movement were reviewed in detail with the patient. Please refer to After Visit Summary for other counseling recommendations.   Return  in about 1 week (around 11/01/2020) for ROB.   Brock Bad, MD  10/25/20

## 2020-10-26 ENCOUNTER — Encounter (HOSPITAL_COMMUNITY): Payer: Self-pay | Admitting: Obstetrics & Gynecology

## 2020-10-26 LAB — CBC
HCT: 38 % (ref 36.0–46.0)
Hemoglobin: 12.8 g/dL (ref 12.0–15.0)
MCH: 31.5 pg (ref 26.0–34.0)
MCHC: 33.7 g/dL (ref 30.0–36.0)
MCV: 93.6 fL (ref 80.0–100.0)
Platelets: 254 10*3/uL (ref 150–400)
RBC: 4.06 MIL/uL (ref 3.87–5.11)
RDW: 14.5 % (ref 11.5–15.5)
WBC: 15.4 10*3/uL — ABNORMAL HIGH (ref 4.0–10.5)
nRBC: 0 % (ref 0.0–0.2)

## 2020-10-26 LAB — RPR: RPR Ser Ql: NONREACTIVE

## 2020-10-26 MED ORDER — ONDANSETRON HCL 4 MG/2ML IJ SOLN: 4.0000 mg | INTRAMUSCULAR | Status: DC | PRN

## 2020-10-26 MED ORDER — SIMETHICONE 80 MG PO CHEW: 80.0000 mg | CHEWABLE_TABLET | ORAL | Status: DC | PRN

## 2020-10-26 MED ORDER — TETANUS-DIPHTH-ACELL PERTUSSIS 5-2.5-18.5 LF-MCG/0.5 IM SUSY: 0.5000 mL | PREFILLED_SYRINGE | Freq: Once | INTRAMUSCULAR | Status: DC

## 2020-10-26 MED ORDER — PRENATAL MULTIVITAMIN CH
1.0000 | ORAL_TABLET | Freq: Every day | ORAL | Status: DC
  Administered 2020-10-26 – 2020-10-27 (×2): 1 via ORAL
  Filled 2020-10-26 (×2): qty 1

## 2020-10-26 MED ORDER — DIPHENHYDRAMINE HCL 25 MG PO CAPS: 25.0000 mg | ORAL_CAPSULE | Freq: Four times a day (QID) | ORAL | Status: DC | PRN

## 2020-10-26 MED ORDER — DIBUCAINE (PERIANAL) 1 % EX OINT: 1.0000 "application " | TOPICAL_OINTMENT | CUTANEOUS | Status: DC | PRN

## 2020-10-26 MED ORDER — ONDANSETRON HCL 4 MG PO TABS: 4.0000 mg | ORAL_TABLET | ORAL | Status: DC | PRN

## 2020-10-26 MED ORDER — WITCH HAZEL-GLYCERIN EX PADS: 1.0000 "application " | MEDICATED_PAD | CUTANEOUS | Status: DC | PRN

## 2020-10-26 MED ORDER — SENNOSIDES-DOCUSATE SODIUM 8.6-50 MG PO TABS
2.0000 | ORAL_TABLET | Freq: Every day | ORAL | Status: DC
  Administered 2020-10-26: 2 via ORAL
  Filled 2020-10-26 (×2): qty 2

## 2020-10-26 MED ORDER — BENZOCAINE-MENTHOL 20-0.5 % EX AERO: 1.0000 "application " | INHALATION_SPRAY | CUTANEOUS | Status: DC | PRN

## 2020-10-26 MED ORDER — ACETAMINOPHEN 325 MG PO TABS
650.0000 mg | ORAL_TABLET | ORAL | Status: DC | PRN
  Administered 2020-10-26: 650 mg via ORAL
  Filled 2020-10-26: qty 2

## 2020-10-26 MED ORDER — ZOLPIDEM TARTRATE 5 MG PO TABS: 5.0000 mg | ORAL_TABLET | Freq: Every evening | ORAL | Status: DC | PRN

## 2020-10-26 MED ORDER — OXYCODONE HCL 5 MG PO TABS
5.0000 mg | ORAL_TABLET | ORAL | Status: DC | PRN
  Administered 2020-10-26 – 2020-10-27 (×5): 10 mg via ORAL
  Filled 2020-10-26 (×5): qty 2

## 2020-10-26 MED ORDER — IBUPROFEN 600 MG PO TABS
600.0000 mg | ORAL_TABLET | Freq: Four times a day (QID) | ORAL | Status: DC
  Administered 2020-10-26 – 2020-10-27 (×6): 600 mg via ORAL
  Filled 2020-10-26 (×6): qty 1

## 2020-10-26 MED ORDER — COCONUT OIL OIL: 1.0000 "application " | TOPICAL_OIL | Status: DC | PRN

## 2020-10-26 NOTE — Lactation Note (Signed)
This note was copied from a baby's chart. Lactation Consultation Note  Patient Name: Janet Rodriguez [id number] Date: 10/26/2020 Age:32 hours  Attempted LC visit to 23 hours old. Mother is on the phone at the time of visit. LC will come back to room at another time as possible.     Janet Rodriguez 10/26/2020, 11:25 PM

## 2020-10-26 NOTE — Lactation Note (Signed)
This note was copied from a baby's chart. Lactation Consultation Note  Patient Name: Boy Shawnika Pepin [id number] Date: 10/26/2020 Reason for consult: Initial assessment;Term Age:32 hours   P2 mother whose infant is now 52 hours old.  This is a term baby at 39+3 weeks weighing < 5 lbs.  Mother's feeding preference is breast/formula.  RN caring for patient.  Stopped in briefly to introduce myself and to let mother know I was available as needed for breast feeding assistance.  Baby's initial blood sugar obtained at 0207 was 61 mg/dl.  He consumed 5 mls of Neosure 22 calorie formula.    Will follow up later this morning after mother has rested.  RN may call as mother desires.  Father present.   Maternal Data Has patient been taught Hand Expression?:  (Will assess later this morning)  Feeding Mother's Current Feeding Choice: Breast Milk and Formula  LATCH Score                    Lactation Tools Discussed/Used    Interventions    Discharge    Consult Status Consult Status: Follow-up Date: 10/26/20 Follow-up type: In-patient    Rushil Kimbrell R Thamas Appleyard 10/26/2020, 4:14 AM

## 2020-10-26 NOTE — Plan of Care (Signed)
Pt admitted to the unit.  Oriented to the room. Reviewed admission packet to include menu, edinburgh, breastfeeding log, and baby safety sheet.  Pt verbalizes understanding. Spouse at bedside for support

## 2020-10-26 NOTE — Lactation Note (Signed)
This note was copied from a baby's chart. Lactation Consultation Note  Patient Name: Janet Rodriguez [id number] Date: 10/26/2020 Reason for consult: Initial assessment;Infant < 6lbs;Term Age:32 hours   P3 mother whose infant is now 72 hours old.  This is a term baby at 39+3 weeks weighing < 5 lbs.  Mother's feeding preference is to exclusively breast feed, however, due to baby's weight she is also supplementing with formula at this time.  Baby was swaddled and asleep in mother's arms when I arrived.  Mother reported that he has not been able to breast feed or bottle feed due to sleepiness.  Offered to assist and mother agreeable.  Reviewed breast feeding basics.  Attempted to latch, but he remained too sleepy.  Baby initially was not interested in sucking on the extra slow flow nipple, but with practice, he consumed 14 mls and burped well.  Placed him in mother's arms after feeding.  Discussed feeding plan for today.  Mother will call for latch assistance as needed and supplement after every feeding.  Mom made aware of O/P services, breastfeeding support groups, community resources, and our phone # for post-discharge questions.  RN in room at the end of my visit and updated.  Mother has a DEBP for home use.  No support person present at this time.   Maternal Data Has patient been taught Hand Expression?: Yes Does the patient have breastfeeding experience prior to this delivery?: Yes How long did the patient breastfeed?: 6-8 months with her other two children  Feeding Mother's Current Feeding Choice: Breast Milk and Formula Nipple Type: Extra Slow Flow  LATCH Score Latch: Too sleepy or reluctant, no latch achieved, no sucking elicited.  Audible Swallowing: None  Type of Nipple: Everted at rest and after stimulation  Comfort (Breast/Nipple): Soft / non-tender  Hold (Positioning): Assistance needed to correctly position infant at breast and maintain latch.  LATCH Score:  5   Lactation Tools Discussed/Used    Interventions Interventions: Breast feeding basics reviewed;Skin to skin;Hand express;Adjust position;Assisted with latch;Support pillows;Position options;Education  Discharge Pump: Personal  Consult Status Consult Status: Follow-up Date: 10/27/20 Follow-up type: In-patient    Jerick Khachatryan R Telesa Jeancharles 10/26/2020, 10:00 AM

## 2020-10-26 NOTE — Plan of Care (Signed)
Pt admitted to unit.  Oriented to the room. Reviewed admission packet to include menu, breastfeeding log, edinburgh score and baby safety sheet.  Pt now resting with spouse at bedside for support

## 2020-10-26 NOTE — Anesthesia Postprocedure Evaluation (Signed)
Anesthesia Post Note  Patient: Janet Rodriguez  Procedure(s) Performed: AN AD HOC LABOR EPIDURAL     Patient location during evaluation: Mother Baby Anesthesia Type: Epidural Level of consciousness: awake, oriented and awake and alert Pain management: pain level controlled Vital Signs Assessment: post-procedure vital signs reviewed and stable Respiratory status: spontaneous breathing, respiratory function stable and nonlabored ventilation Cardiovascular status: stable Postop Assessment: no headache, adequate PO intake, able to ambulate, patient able to bend at knees and no apparent nausea or vomiting Anesthetic complications: no   No notable events documented.  Last Vitals:  Vitals:   10/26/20 0600 10/26/20 0940  BP: 111/61 96/70  Pulse: 66 60  Resp: 16 16  Temp: 36.8 C 36.7 C  SpO2: 100%     Last Pain:  Vitals:   10/26/20 0940  TempSrc: Oral  PainSc:    Pain Goal: Patients Stated Pain Goal: 3 (10/26/20 0305)                 Carrie Usery

## 2020-10-26 NOTE — Progress Notes (Signed)
Patient ID: Janet Rodriguez, female   DOB: [birth year], 30 y.o.   MRN: [id number]  Post Partum Day 1 Subjective: no complaints, up ad lib, voiding, tolerating PO, + flatus, and her pain is 8/10 in quality but she states she can handle it well.  Objective: Blood pressure 111/61, pulse 66, temperature 98.2 F (36.8 C), temperature source Oral, resp. rate 16, last menstrual period 12/18/2019, SpO2 100 %, unknown if currently breastfeeding.  Physical Exam:  General: alert, cooperative, appears stated age, and no distress Lochia: appropriate Uterine Fundus: firm DVT Evaluation: No evidence of DVT seen on physical exam. No cords or calf tenderness. No significant calf/ankle edema.  Recent Labs    10/25/20 2139 10/26/20 0426  HGB 12.6 12.8  HCT 38.4 38.0   Assessment/Plan: Patient is overall doing well. Discharge home tomorrow if continued improvement. She is breastfeeding and bottle feeding, and she needs circumcision for baby and POP for contraception prior to discharge.   LOS: 1 day   Samantha Crimes 10/26/2020, 7:00 AM

## 2020-10-27 MED ORDER — SLYND 4 MG PO TABS: 1.0000 | ORAL_TABLET | Freq: Every day | ORAL | 1 refills | Status: DC

## 2020-10-27 MED ORDER — IBUPROFEN 600 MG PO TABS: 600.0000 mg | ORAL_TABLET | Freq: Four times a day (QID) | ORAL | 0 refills | Status: DC

## 2020-10-27 MED ORDER — ACETAMINOPHEN 325 MG PO TABS: 650.0000 mg | ORAL_TABLET | ORAL | Status: DC | PRN

## 2020-10-27 NOTE — Discharge Instructions (Signed)
You can start your birth control pills about 2 weeks after you get home. They have been sent to your pharmacy.   Congratulations!   -Take tylenol 1000 mg every 6 hours as needed for pain, alternate with ibuprofen 600 mg every 6 hours -Drink plenty of water to help with breastfeeding -Continue prenatal vitamins while you are breastfeeding - Make sure if you haven't scheduled your postpartum visit, to schedule to be seen around 4-6 weeks or sooner if needed

## 2020-10-27 NOTE — Progress Notes (Signed)
Patient ID: Janet Rodriguez, female   DOB: [birth year], 30 y.o.   MRN: [id number]  Post Partum Day 2 Subjective: no complaints, up ad lib, voiding, tolerating PO, + flatus, and patient is eager to go home.  Objective: Blood pressure 97/60, pulse 67, temperature 97.9 F (36.6 C), temperature source Oral, resp. rate 18, last menstrual period 12/18/2019, SpO2 100 %, unknown if currently breastfeeding.  Physical Exam:  General: alert, cooperative, appears stated age, and no distress Lochia: appropriate Uterine Fundus: firm DVT Evaluation: No evidence of DVT seen on physical exam. No cords or calf tenderness. No significant calf/ankle edema.  Recent Labs    10/25/20 2139 10/26/20 0426  HGB 12.6 12.8  HCT 38.4 38.0   Assessment/Plan: Discharge today, Breastfeeding and Bottle feeding, Circumcision prior to discharge, and Contraception with POPs prior to discharge   LOS: 2 days   Samantha Crimes 10/27/2020, 5:38 AM

## 2020-10-27 NOTE — Lactation Note (Signed)
This note was copied from a baby's chart. Lactation Consultation Note  Patient Name: Janet Rodriguez [id number] Date: 10/27/2020 Reason for consult: Follow-up assessment Age:32 hours Mother reports that infant latches with poor latch. She reports that she is continuing to try to breastfeed infant but he just nuzzles at breast and refuses to latch.  Mother reports that infant took 20 ml with bottle.  Discussed pace bottle feeding. Mother has DR brown premie bottles at home.  Mother has a DR Manson Passey pump she tried using today. She reports that she obtained a few drops of colostrum. Mother reports that she is active with WIC. A WIC referral assistance form sent into [facility] to obtain a hospital grade pump for mother .   Mother was given a plan to pump every 3 hours for 15 mins  supplement infant according to guidelines.  Suggested that mother follow up with [facility] for feeding assessment.  Mother reports that she would like to exclusively breastfeed this infant because she did her last two children.  LC OP dept notified for mother get a call to schedule an appt. Mother aware of available numbers for [facility] assistance.     Maternal Data    Feeding Mother's Current Feeding Choice: Breast Milk Nipple Type: Extra Slow Flow  LATCH Score                    Lactation Tools Discussed/Used    Interventions    Discharge Discharge Education: Engorgement and breast care;Warning signs for feeding baby;Outpatient recommendation;Outpatient Epic message sent  Consult Status Consult Status: Complete    Michel Bickers 10/27/2020, 1:16 PM

## 2020-10-28 ENCOUNTER — Telehealth: Payer: Self-pay

## 2020-10-28 NOTE — Telephone Encounter (Signed)
Transition Care Management Follow-up Telephone Call Date of discharge and from where: 10/27/2020-[facility]  How have you been since you were released from the hospital? Patient stated she is doing fine and happy to be home. Any questions or concerns? No  Items Reviewed: Did the pt receive and understand the discharge instructions provided? Yes  Medications obtained and verified? Yes  Other? No  Any new allergies since your discharge? No  Dietary orders reviewed? N/A Do you have support at home? No   Home Care and Equipment/Supplies: Were home health services ordered? not applicable If so, what is the name of the agency? N/A  Has the agency set up a time to come to the patient's home? not applicable Were any new equipment or medical supplies ordered?  No What is the name of the medical supply agency? N/A Were you able to get the supplies/equipment? not applicable Do you have any questions related to the use of the equipment or supplies? No  Functional Questionnaire: (I = Independent and D = Dependent) ADLs: I  Bathing/Dressing- I  Meal Prep- I  Eating- I  Maintaining continence- I  Transferring/Ambulation- I  Managing Meds- I  Follow up appointments reviewed:  PCP Hospital f/u appt confirmed? No   Specialist Hospital f/u appt confirmed? Yes  Scheduled to see Dr. Clearance Coots on 11/25/2020 @ 10:15 am. Are transportation arrangements needed? No  If their condition worsens, is the pt aware to call PCP or go to the Emergency Dept.? Yes Was the patient provided with contact information for the PCP's office or ED? Yes Was to pt encouraged to call back with questions or concerns? Yes

## 2020-11-01 ENCOUNTER — Encounter: Payer: Medicaid Other | Admitting: Obstetrics & Gynecology

## 2020-11-05 ENCOUNTER — Inpatient Hospital Stay (HOSPITAL_COMMUNITY)
Admission: AD | Admit: 2020-11-05 | Payer: Medicaid Other | Source: Home / Self Care | Admitting: Obstetrics and Gynecology

## 2020-11-05 ENCOUNTER — Inpatient Hospital Stay (HOSPITAL_COMMUNITY): Payer: Medicaid Other

## 2020-11-09 ENCOUNTER — Telehealth (HOSPITAL_COMMUNITY): Payer: Self-pay | Admitting: *Deleted

## 2020-11-09 NOTE — Telephone Encounter (Signed)
Mom reports feeling good. No concerns about herself. EPDS=2 (hospital score=0) Mom reports baby is well. Feeding, peeing, and pooping without difficulty. Sleeps in bassinet on back, reviewed ABC's of safe sleep. Mom reports no concerns about baby.  Duffy Rhody, RN 11-09-2020 at 9:16am

## 2020-11-25 ENCOUNTER — Other Ambulatory Visit: Payer: Self-pay

## 2020-11-25 ENCOUNTER — Encounter: Payer: Self-pay | Admitting: Obstetrics

## 2020-11-25 ENCOUNTER — Ambulatory Visit (INDEPENDENT_AMBULATORY_CARE_PROVIDER_SITE_OTHER): Payer: Medicaid Other | Admitting: Obstetrics

## 2020-11-25 DIAGNOSIS — Z3041 Encounter for surveillance of contraceptive pills: Secondary | ICD-10-CM | POA: Diagnosis not present

## 2020-11-25 NOTE — Progress Notes (Signed)
Post Partum Visit Note  Janet Rodriguez is a 30 y.o. [phone] female who presents for a postpartum visit. She is 4 weeks postpartum following a normal spontaneous vaginal delivery.  I have fully reviewed the prenatal and intrapartum course. The delivery was at  [redacted]w[redacted]d gestational weeks.  Anesthesia: Epidural. Postpartum course has been unremarkable. Baby is doing well. Baby is feeding by breast. Bleeding no bleeding. Bowel function is normal. Bladder function is normal. Patient is sexually active. Contraception method is none. Postpartum depression screening: negative.   The pregnancy intention screening data noted above was reviewed. Potential methods of contraception were discussed. The patient elected to proceed with No data recorded.   Edinburgh Postnatal Depression Scale - 11/25/20 1044       Edinburgh Postnatal Depression Scale:  In the Past 7 Days   I have been able to laugh and see the funny side of things. 0    I have looked forward with enjoyment to things. 0    I have blamed myself unnecessarily when things went wrong. 0    I have been anxious or worried for no good reason. 0    I have felt scared or panicky for no good reason. 0    Things have been getting on top of me. 0    I have been so unhappy that I have had difficulty sleeping. 0    I have felt sad or miserable. 0    I have been so unhappy that I have been crying. 0    The thought of harming myself has occurred to me. 0    Edinburgh Postnatal Depression Scale Total 0             Health Maintenance Due  Topic Date Due   COVID-19 Vaccine (1) Never done   INFLUENZA VACCINE  Never done    The following portions of the patient's history were reviewed and updated as appropriate: allergies, current medications, past family history, past medical history, past social history, past surgical history, and problem list.  Review of Systems A comprehensive review of systems was negative.  Objective:  BP 110/67    Pulse 64   Ht 5' 5.5" (1.664 m)   Wt 137 lb 14.4 oz (62.6 kg)   LMP 12/18/2019 (Exact Date)   Breastfeeding Yes   BMI 22.60 kg/m    General:  alert and no distress   Breasts:  normal  Lungs: clear to auscultation bilaterally  Heart:  regular rate and rhythm, S1, S2 normal, no murmur, click, rub or gallop  Abdomen: soft, non-tender; bowel sounds normal; no masses,  no organomegaly   Wound none  GU exam:  not indicated       Assessment:   1. Postpartum care following vaginal delivery - doing well  2. Encounter for surveillance of contraceptive pills - has not started OCP's yet. Samples dispensed with instructions. - she is breast feeding    Plan:   Essential components of care per ACOG recommendations:  1.  Mood and well being: Patient with negative depression screening today. Reviewed local resources for support.  - Patient tobacco use? No.   - hx of drug use? No.    2. Infant care and feeding:  -Patient currently breastmilk feeding? Yes. Discussed returning to work and pumping. Reviewed importance of draining breast regularly to support lactation.  -Social determinants of health (SDOH) reviewed in EPIC. No concerns  3. Sexuality, contraception and birth spacing - Patient does not want a  pregnancy in the next year.  Desired family size is 3 children.  - Reviewed forms of contraception in tiered fashion. Patient desired oral progesterone-only contraceptive today.   - Discussed birth spacing of 18 months  4. Sleep and fatigue -Encouraged family/partner/community support of 4 hrs of uninterrupted sleep to help with mood and fatigue  5. Physical Recovery  - Discussed patients delivery and complications. She describes her labor as good. - Patient had a Vaginal, no problems at delivery. Patient had a  no  lacerations. Perineal healing reviewed.  - Patient has urinary incontinence? No. - Patient is safe to resume physical and sexual activity  6.  Health Maintenance - HM  due items addressed Yes - Last pap smear  Diagnosis  Date Value Ref Range Status  03/30/2020   Final   - Negative for intraepithelial lesion or malignancy (NILM)   Pap smear not done at today's visit.  -Breast Cancer screening indicated? No.   7. Chronic Disease/Pregnancy Condition follow up: None   Coral Ceo, MD Center for [facility], [facility]   11/25/20

## 2020-12-23 ENCOUNTER — Telehealth: Payer: Medicaid Other | Admitting: Obstetrics and Gynecology

## 2020-12-23 NOTE — Progress Notes (Deleted)
Provider location: Center for [facility]   Patient location: Home  I connected withNAME@ on 12/23/20 at 11:15 AM EST by Mychart Video Encounter and verified that I am speaking with the correct person using two identifiers.       I discussed the limitations, risks, security and privacy concerns of performing an evaluation and management service virtually and the availability of in person appointments. I also discussed with the patient that there may be a patient responsible charge related to this service. The patient expressed understanding and agreed to proceed.  Post Partum Visit Note Subjective:   Janet Rodriguez is a 30 y.o. [phone] female who presents for a postpartum visit. She is 8 weeks postpartum following a normal spontaneous vaginal delivery.  I have fully reviewed the prenatal and intrapartum course. The delivery was at 39.3 gestational weeks.  Anesthesia: epidural. Postpartum course has been unremarkable. Baby is doing well. Baby is feeding by breast. Bleeding no bleeding. Bowel function is normal. Bladder function is normal. Patient is sexually active. Contraception method is none. Postpartum depression screening: negative.   The pregnancy intention screening data noted above was reviewed. Potential methods of contraception were discussed. The patient elected to proceed with No data recorded.   Edinburgh Postnatal Depression Scale - 12/23/20 0849       Edinburgh Postnatal Depression Scale:  In the Past 7 Days   I have been able to laugh and see the funny side of things. 0    I have looked forward with enjoyment to things. 0    I have blamed myself unnecessarily when things went wrong. 0    I have been anxious or worried for no good reason. 0    I have felt scared or panicky for no good reason. 0    Things have been getting on top of me. 0    I have been so unhappy that I have had difficulty sleeping. 0    I have felt sad or miserable. 0    I have been  so unhappy that I have been crying. 0    The thought of harming myself has occurred to me. 0    Edinburgh Postnatal Depression Scale Total 0             {Common ambulatory SmartLinks:19316}  Review of Systems {ros; complete:30496}  Objective:  LMP 12/18/2019 (Exact Date)     General:  Alert, oriented and cooperative. Patient is in no acute distress.  Respiratory: Normal respiratory effort, no problems with respiration noted  Mental Status: Normal mood and affect. Normal behavior. Normal judgment and thought content.  Rest of physical exam deferred due to type of encounter   Assessment:    *** postpartum exam.  Plan:  Essential components of care per ACOG recommendations:  1.  Mood and well being: Patient with {gen negative/positive:315881} depression screening today. Reviewed local resources for support.  - Patient {Action; does/does not:19097} use tobacco. ***If using tobacco we discussed reduction and for recently cessation risk of relapse - hx of drug use? {yes/no:20286}  *** If yes, discussed support systems  2. Infant care and feeding:  -Patient currently breastmilk feeding? {yes/no:20286} ***If breastmilk feeding discussed return to work and pumping. If needed, patient was provided letter for work to allow for every 2-3 hr pumping breaks, and to be granted a private location to express breastmilk and refrigerated area to store breastmilk. Reviewed importance of draining breast regularly to support lactation. -Social determinants of health (SDOH)  reviewed in EPIC. No concerns***The following needs were identified***  3. Sexuality, contraception and birth spacing - Patient {DOES_DOES IFO:27741} want a pregnancy in the next year.  Desired family size is {NUMBER 1-10:22536} children.  - Reviewed forms of contraception in tiered fashion. Patient desired {PLAN CONTRACEPTION:313102} today.   - Discussed birth spacing of 18 months  4. Sleep and fatigue -Encouraged  family/partner/community support of 4 hrs of uninterrupted sleep to help with mood and fatigue  5. Physical Recovery  - Discussed patients delivery*** and complications - Patient had a *** degree laceration, perineal healing reviewed. Patient expressed understanding - Patient has urinary incontinence? {yes/no:20286}*** Patient was referred to pelvic floor PT  - Patient {ACTION; IS/IS OIN:86767209} safe to resume physical and sexual activity  6.  Health Maintenance - Last pap smear done *** and was {Normal/abnormal wildcard:19619} with negative HPV. ***Mammogram  7. ***Chronic Disease - PCP follow up  I provided *** minutes of face-to-face time during this encounter.    No follow-ups on file.  Future Appointments  Date Time Provider Department Center  12/23/2020 11:15 AM Warden Fillers, MD [facility] None     Center for [employer], [facility]

## 2020-12-26 NOTE — Progress Notes (Signed)
Pt set up for mychart visit.  Review of chart showed that she has already had her postpartum visit. And no further follow up was scheduled.  Pt also unaware of need for current appointment.

## 2021-05-18 IMAGING — US US OB < 14 WEEKS - US OB TV
1 series · 15 of 28 positions shown · non-contrast
Comparison: None.

CLINICAL DATA: Abdominal cramping, pregnancy

EXAM:
OBSTETRIC <14 WK ULTRASOUND
TECHNIQUE: Transabdominal ultrasound was performed for evaluation of the
gestation as well as the maternal uterus and adnexal regions.

[Series 1: us ob < 14 weeks - us ob tv · 64 acquisitions, 15 frames shown]
[im 1/64]
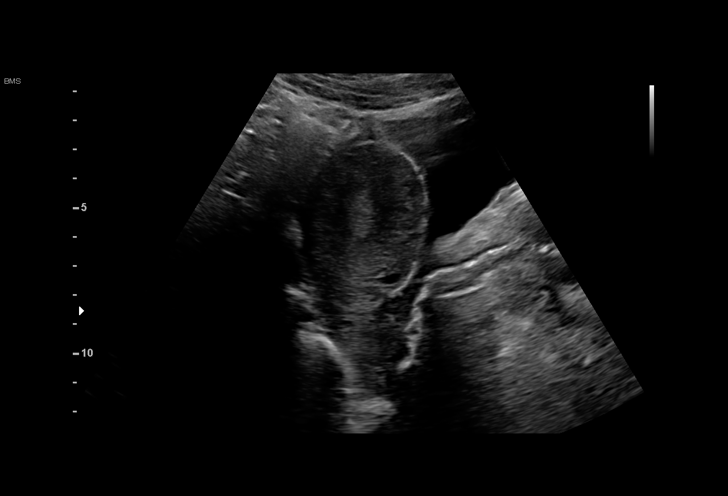
[im 5/64]
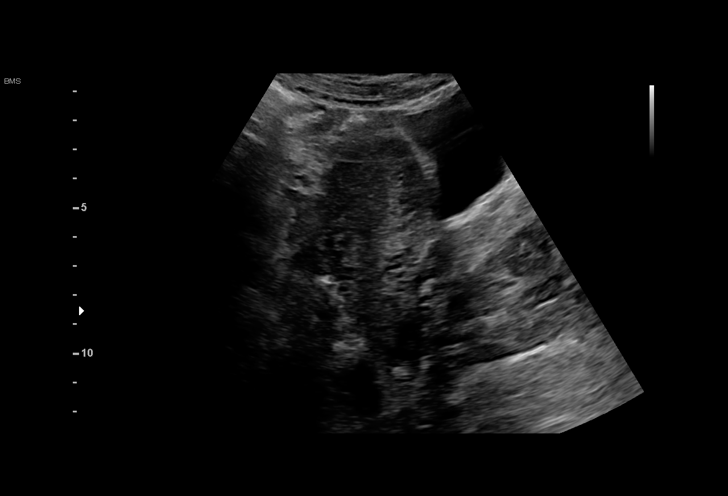
[im 10/64]
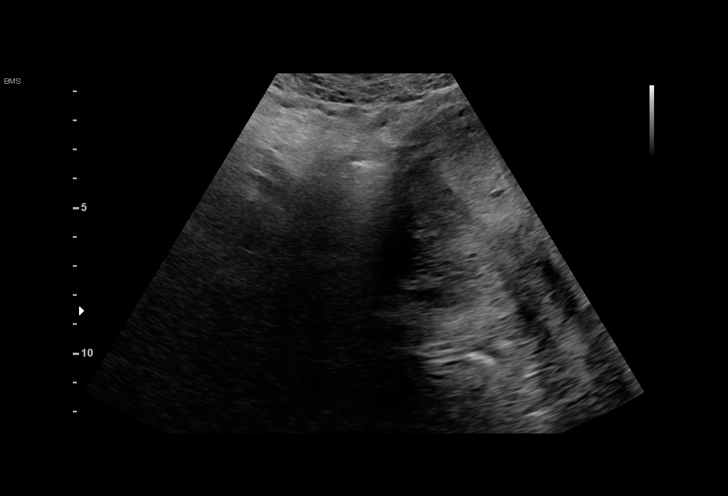
[im 15/64]
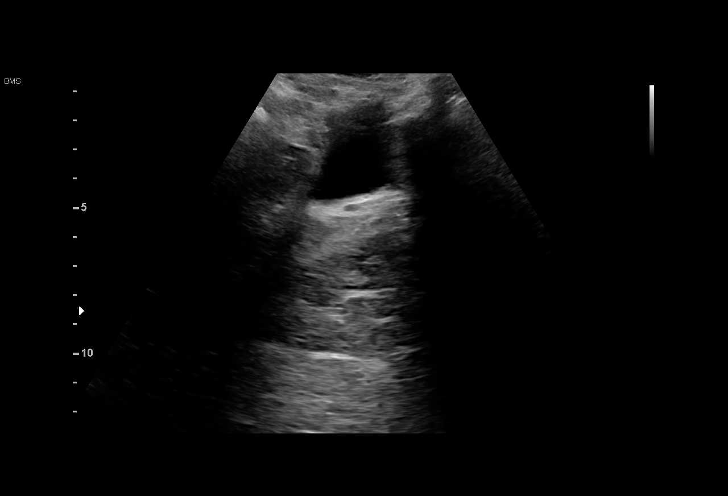
[im 19/64]
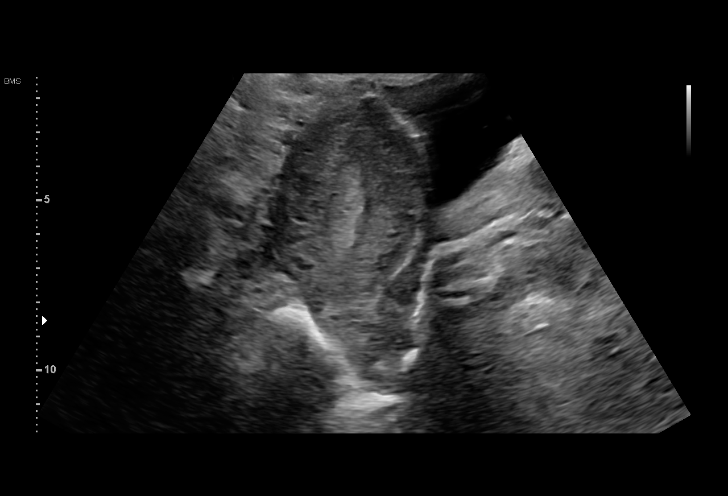
[im 24/64]
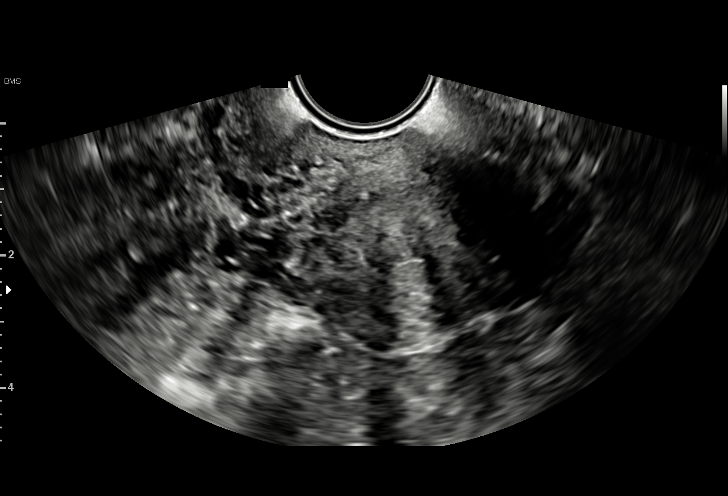
[im 29/64]
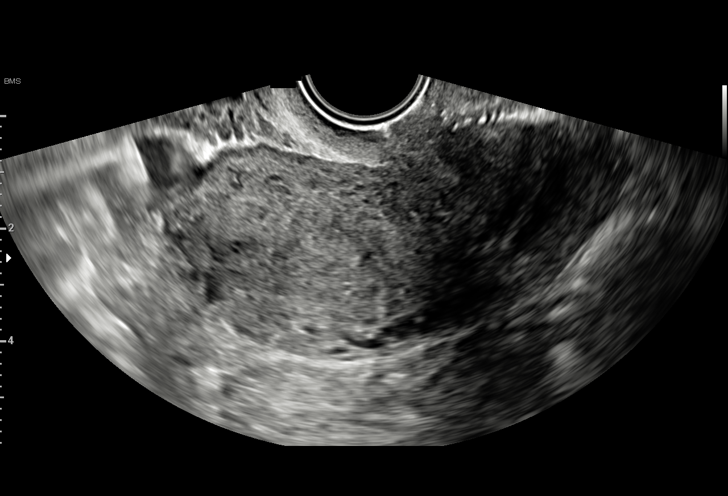
[im 33/64]
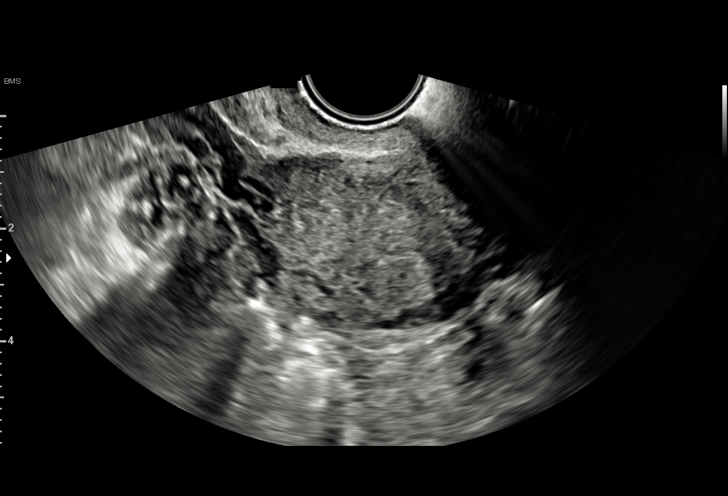
[im 36/64]
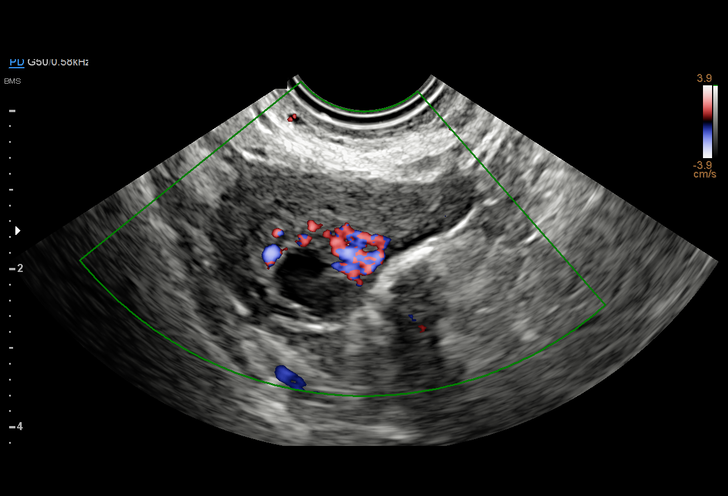
[im 40/64]
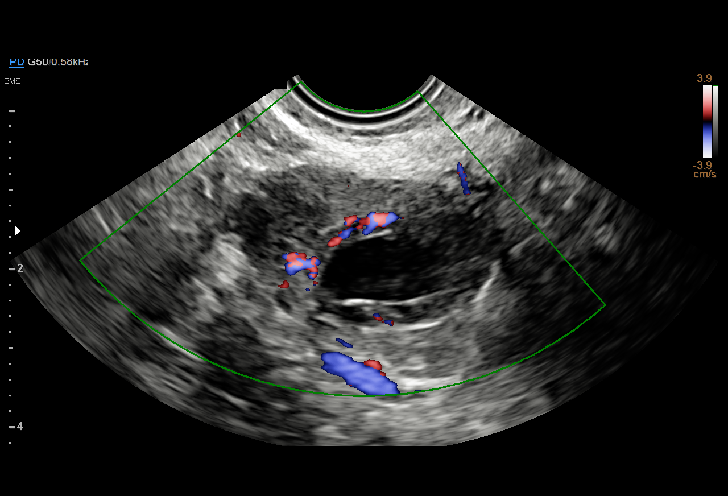
[im 45/64]
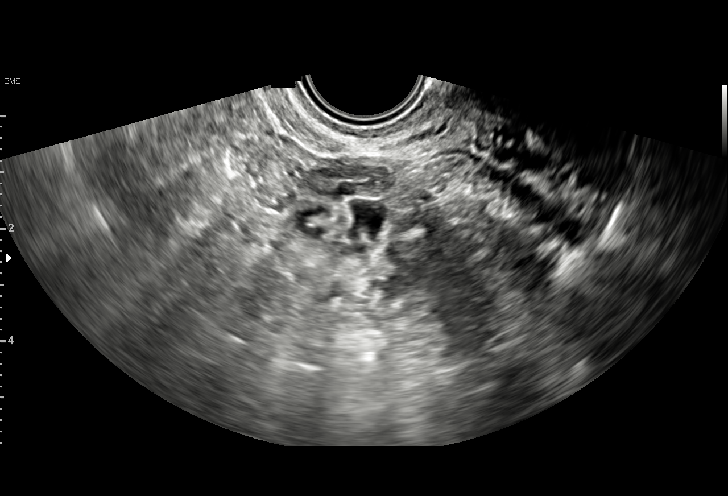
[im 50/64]
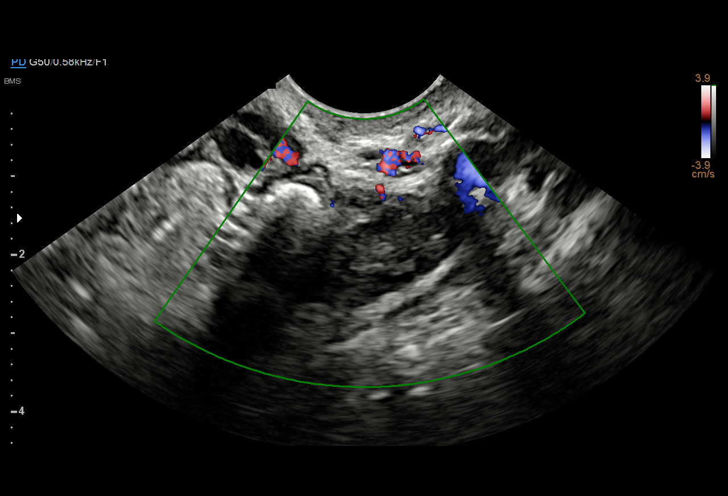
[im 54/64]
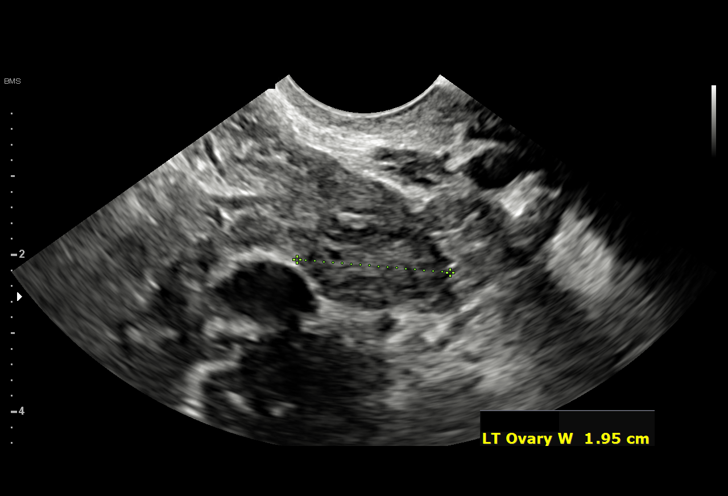
[im 59/64]
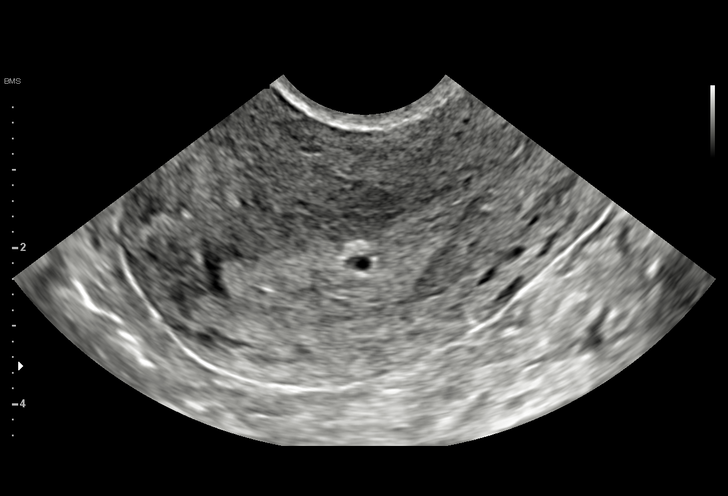
[im 64/64]
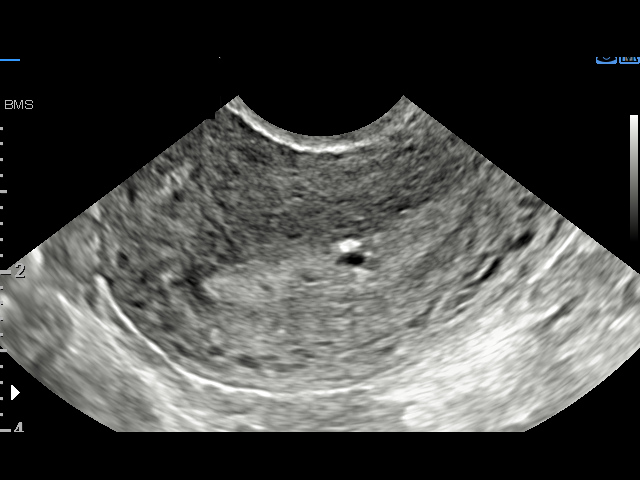

[15 of 28 positions shown; findings below may reference images not displayed]

FINDINGS: Intrauterine gestational sac: Single candidate intrauterine
gestational sac

Yolk sac:  Not Visualized.

Embryo:  Not Visualized.

Cardiac Activity: Not Visualized.

Heart Rate: Not visualized.

MSD:  3 mm   4 w   6 d

CRL: Not visualized US EDC: 07/16/2020

Subchorionic hemorrhage:  None visualized.

Maternal uterus/adnexae: Unremarkable.
IMPRESSION: Single candidate intrauterine gestational sac without identified
embryo, sonographic gestational age of 4 weeks, 6 days. Very early
candidate intrauterine pregnancy of uncertain viability. Recommend
follow-up ultrasound examination in 7-14 days to assess for
continued development and viability. EDD 07/16/2020.

## 2021-06-04 IMAGING — US US OB TRANSVAGINAL
1 series · 14 of 28 positions shown · non-contrast
Comparison: Prior study on 11/13/2019

CLINICAL DATA: Followup for early pregnancy. Quantitative beta HCG
Patient is 7 weeks 6 days by LMP.

EXAM:
TRANSVAGINAL OB ULTRASOUND
TECHNIQUE: Transvaginal ultrasound was performed for complete evaluation of the
gestation as well as the maternal uterus, adnexal regions, and
pelvic cul-de-sac.

[Series 1: us ob transvaginal · 77 acquisitions, 14 frames shown]
[im 3/77]
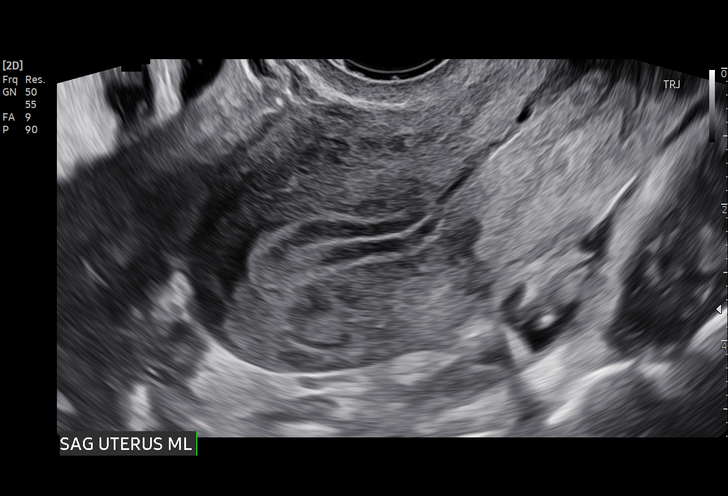
[im 9/77]
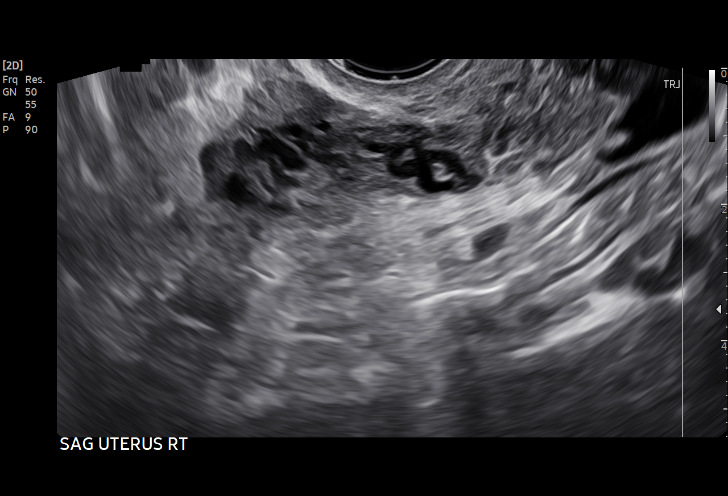
[im 15/77]
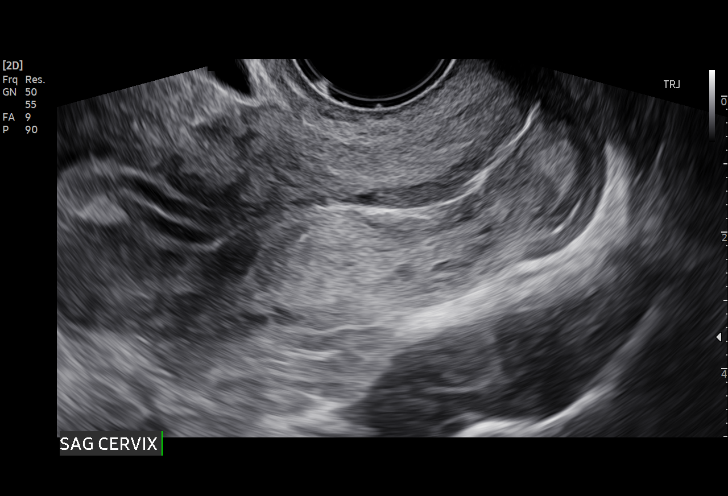
[im 20/77]
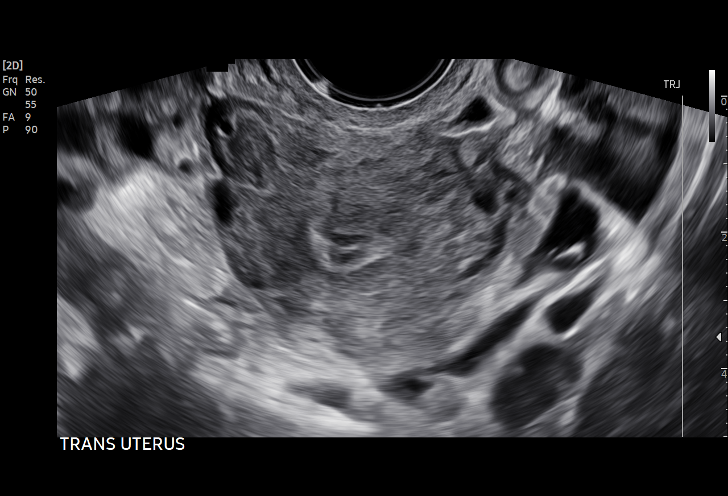
[im 26/77]
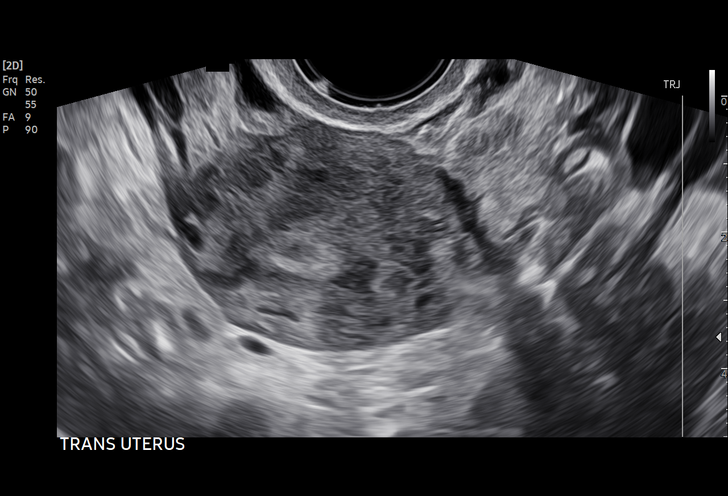
[im 31/77]
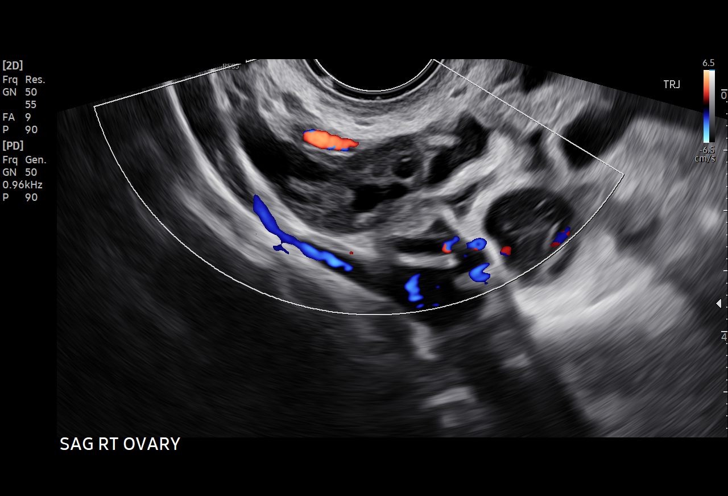
[im 37/77]
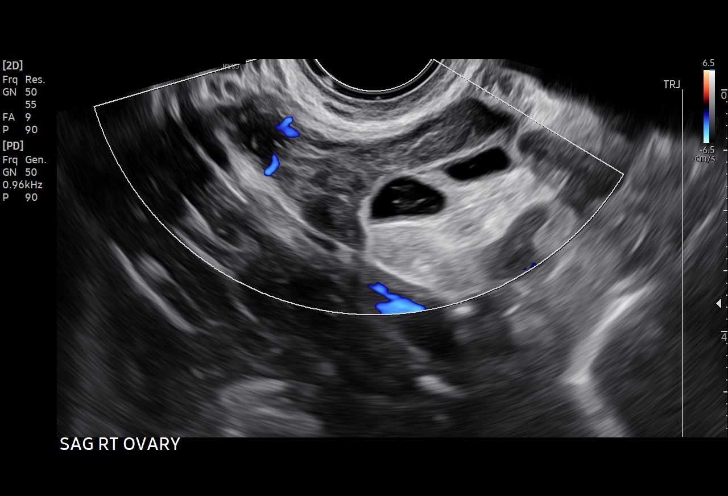
[im 43/77]
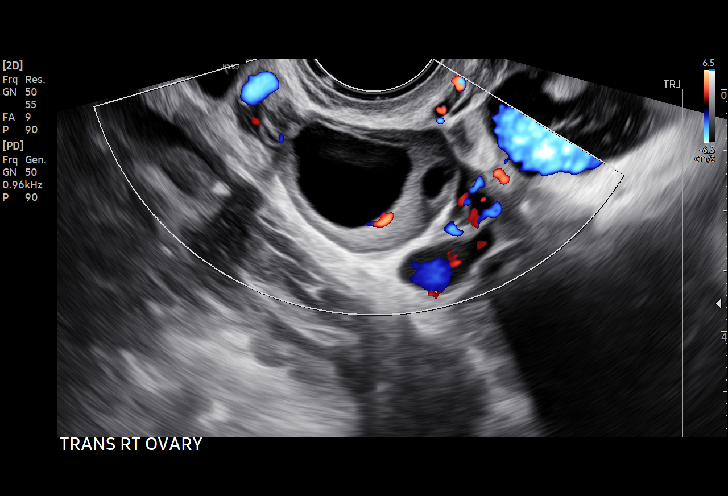
[im 48/77]
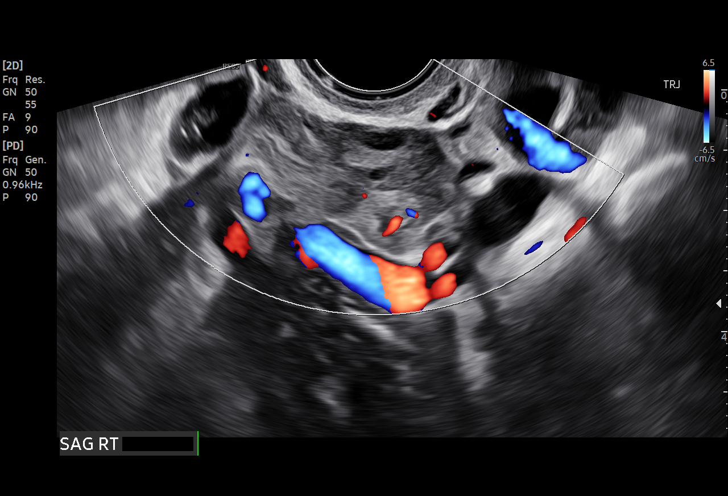
[im 54/77]
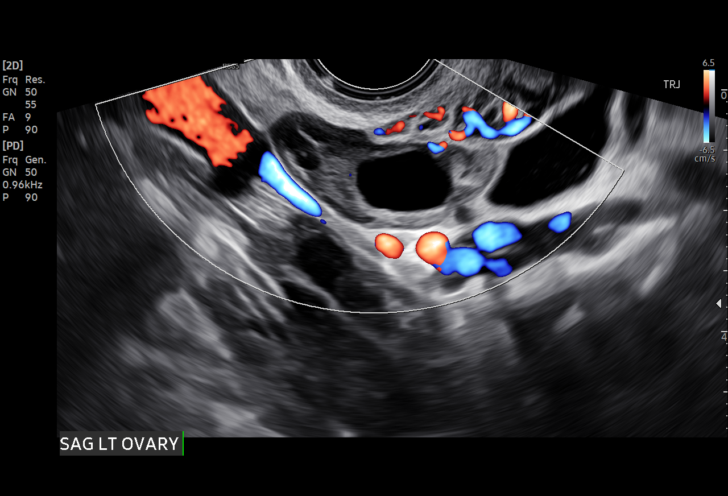
[im 60/77]
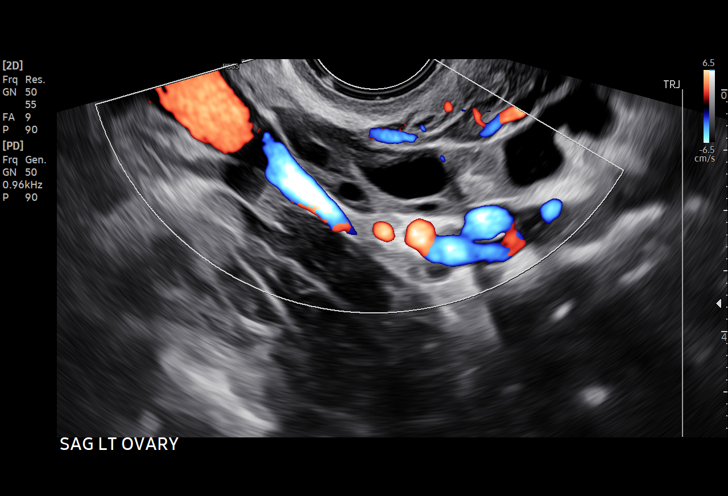
[im 65/77]
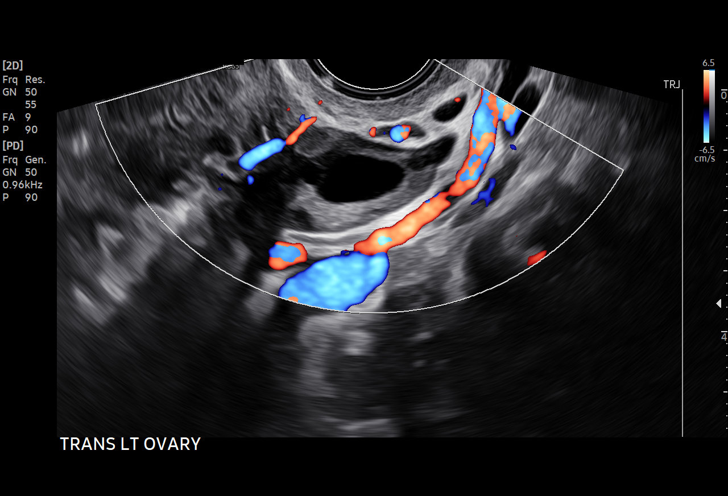
[im 71/77]
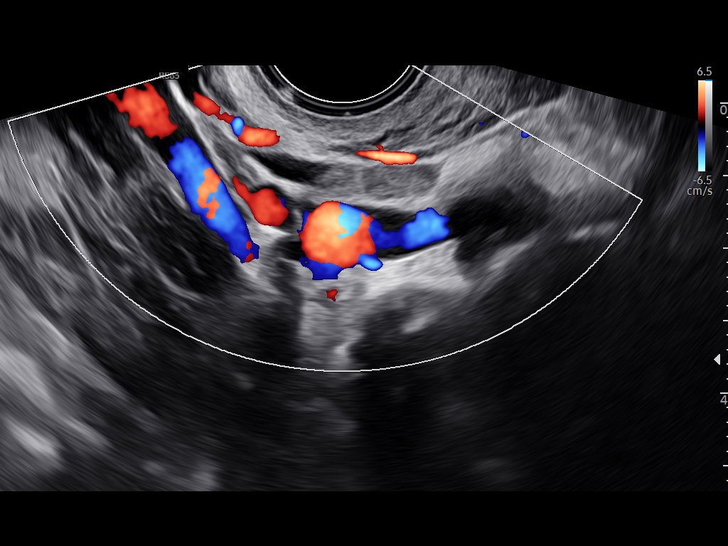
[im 77/77]
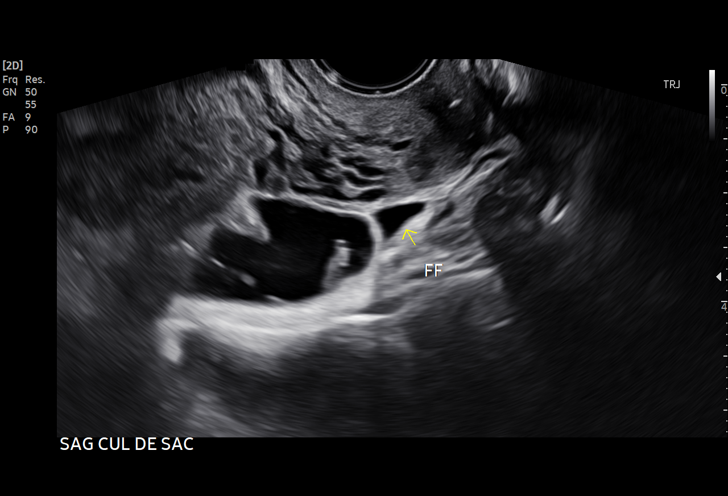

[14 of 28 positions shown; findings below may reference images not displayed]

FINDINGS: Intrauterine gestational sac: None. The questioned intrauterine
gestational sac seen on the previous exam is not present today.

Yolk sac:  Not Visualized.

Embryo:  Not Visualized.

Cardiac Activity: Not Visualized.

Subchorionic hemorrhage:  None visualized.

Maternal uterus/adnexae: Trace free pelvic fluid.

RIGHT ovary: Small follicles are present. A small soft tissue mass
is identified adjacent to the RIGHT ovary and is possibly separate
from the ovary, measuring 1.9 x 1.1 x 1.6 centimeters. Internal
blood flow is identified on Doppler evaluation. Considerations
include corpus luteum or ectopic pregnancy.

LEFT ovary contains small follicles. A soft tissue mass is
identified in the LEFT adnexal region and possibly within a fluid
filled tubular structure. This mass measures 1.6 x 0.6 x
centimeters. No definitive internal blood flow identified in this
mass.
IMPRESSION: 1. No intrauterine gestational sac.
2. Soft tissue mass adjacent to the RIGHT ovary, consistent with
corpus luteum cyst or ectopic pregnancy.
3. Soft tissue mass in the LEFT adnexal region, raising the question
of ectopic pregnancy versus debris within the fallopian tube.
4. Recommend close follow-up with quantitative beta HCG measurements
and ultrasound studies. The patient is scheduled to be seen at the
[REDACTED] today 4 o'clock p.m.
5. Critical Value/emergent results were called by telephone at the
time of interpretation on 11/30/2019 at [DATE] to the triage nurse
at [HOSPITAL], who verbally acknowledged these results. At her request,
the patient was released from the ultrasound department and advised
to keep her 4 o'clock appointment today.

## 2021-07-13 ENCOUNTER — Ambulatory Visit (INDEPENDENT_AMBULATORY_CARE_PROVIDER_SITE_OTHER): Payer: Medicaid Other | Admitting: Emergency Medicine

## 2021-07-13 VITALS — BP 117/72 | HR 84 | Wt 132.4 lb

## 2021-07-13 DIAGNOSIS — N912 Amenorrhea, unspecified: Secondary | ICD-10-CM

## 2021-07-13 DIAGNOSIS — Z3201 Encounter for pregnancy test, result positive: Secondary | ICD-10-CM | POA: Diagnosis not present

## 2021-07-13 LAB — POCT URINE PREGNANCY: Preg Test, Ur: POSITIVE — AB

## 2021-07-13 MED ORDER — PRENATAL VITAMINS 28-0.8 MG PO TABS: 1.0000 | ORAL_TABLET | Freq: Every day | ORAL | 11 refills | Status: AC

## 2021-07-13 NOTE — Progress Notes (Signed)
Ms. Tabora presents today for UPT. She has no unusual complaints. LMP: 05/08/2021    OBJECTIVE: Appears well, in no apparent distress.  OB History     Gravida  6   Para  3   Term  3   Preterm      AB  2   Living  3      SAB  2   IAB      Ectopic      Multiple      Live Births  3          Home UPT Result: positive In-Office UPT result:positive I have reviewed the patient's medical, obstetrical, social, and family histories, and medications.   ASSESSMENT: Positive pregnancy test  PLAN Prenatal care to be completed at: [facility]  PNV Rx sent to pharmacy

## 2021-07-13 NOTE — Progress Notes (Signed)
Patient was assessed and managed by nursing staff during this encounter. I have reviewed the chart and agree with the documentation and plan. I have also made any necessary editorial changes.  Warden Fillers, MD 07/13/2021 1:16 PM

## 2021-07-19 ENCOUNTER — Telehealth: Payer: Self-pay | Admitting: Emergency Medicine

## 2021-07-19 NOTE — Telephone Encounter (Signed)
Pt called with concerns of spotting today. Reports she usually has spotting intercourse, but she hasn't had intercourse since last weekend. Endorses low back pain. Has used 2 pads today but denies filling up pads.  This RN instructed pt to go to MAU to be evaluated. Pt agreed and stated she can't go until tomorrow morning d/t childcare.

## 2021-07-20 ENCOUNTER — Inpatient Hospital Stay (HOSPITAL_COMMUNITY)
Admission: AD | Admit: 2021-07-20 | Discharge: 2021-07-20 | Disposition: A | Discharge status: Home / Self Care | Payer: Medicaid Other | Attending: Obstetrics and Gynecology | Admitting: Obstetrics and Gynecology

## 2021-07-20 ENCOUNTER — Encounter (HOSPITAL_COMMUNITY): Payer: Self-pay | Admitting: Obstetrics and Gynecology

## 2021-07-20 ENCOUNTER — Inpatient Hospital Stay (HOSPITAL_COMMUNITY): Payer: Medicaid Other

## 2021-07-20 ENCOUNTER — Other Ambulatory Visit: Payer: Self-pay

## 2021-07-20 DIAGNOSIS — O209 Hemorrhage in early pregnancy, unspecified: Secondary | ICD-10-CM | POA: Diagnosis not present

## 2021-07-20 DIAGNOSIS — R1030 Lower abdominal pain, unspecified: Secondary | ICD-10-CM | POA: Insufficient documentation

## 2021-07-20 DIAGNOSIS — Z3A10 10 weeks gestation of pregnancy: Secondary | ICD-10-CM

## 2021-07-20 DIAGNOSIS — O3680X0 Pregnancy with inconclusive fetal viability, not applicable or unspecified: Secondary | ICD-10-CM | POA: Insufficient documentation

## 2021-07-20 DIAGNOSIS — O26891 Other specified pregnancy related conditions, first trimester: Secondary | ICD-10-CM | POA: Insufficient documentation

## 2021-07-20 DIAGNOSIS — N939 Abnormal uterine and vaginal bleeding, unspecified: Secondary | ICD-10-CM | POA: Diagnosis not present

## 2021-07-20 DIAGNOSIS — Z3A01 Less than 8 weeks gestation of pregnancy: Secondary | ICD-10-CM | POA: Insufficient documentation

## 2021-07-20 LAB — URINALYSIS, ROUTINE W REFLEX MICROSCOPIC
Bilirubin Urine: NEGATIVE
Glucose, UA: NEGATIVE mg/dL
Ketones, ur: NEGATIVE mg/dL
Nitrite: NEGATIVE
Protein, ur: NEGATIVE mg/dL
Specific Gravity, Urine: 1.021 (ref 1.005–1.030)
pH: 5 (ref 5.0–8.0)

## 2021-07-20 LAB — HCG, QUANTITATIVE, PREGNANCY: hCG, Beta Chain, Quant, S: 613 m[IU]/mL — ABNORMAL HIGH (ref <5)

## 2021-07-20 NOTE — MAU Provider Note (Signed)
History     [id number]  Arrival date and time: 07/20/21 1739    Chief Complaint  Patient presents with   Vaginal Bleeding     HPI Janet Rodriguez is a 31 y.o. at [redacted]w[redacted]d by sure LMP with PMHx notable for 3 vaginal deliveries and 2 SAB, who presents for vaginal bleeding.   Patient reports started spotting yesterday and has continued today Bright red blood Some lower abdominal cramping on and off No burning or pain with urination No vaginal discharge No nausea or vomiting   --/--/A POS (09/13 2124)  OB History     Gravida  6   Para  3   Term  3   Preterm      AB  2   Living  3      SAB  2   IAB      Ectopic      Multiple      Live Births  3           Past Medical History:  Diagnosis Date   BV (bacterial vaginosis)    Headache(784.0)    History of pelvic fracture    Infection     Past Surgical History:  Procedure Laterality Date   ROOT CANAL      Family History  Problem Relation Age of Onset   Cancer Maternal Grandfather    Cancer Paternal Grandmother    Healthy Mother    Healthy Father     Social History   Socioeconomic History   Marital status: Significant Other    Spouse name: Not on file   Number of children: Not on file   Years of education: Not on file   Highest education level: Not on file  Occupational History   Not on file  Tobacco Use   Smoking status: Former    Packs/day: 0.25    Years: 10.00    Total pack years: 2.50    Types: Cigarettes    Quit date: 10/28/2011    Years since quitting: 9.7   Smokeless tobacco: Never   Tobacco comments:    NO longer smoking   Vaping Use   Vaping Use: Never used  Substance and Sexual Activity   Alcohol use: Not Currently    Comment: occ   Drug use: No   Sexual activity: Yes    Partners: Male    Birth control/protection: None  Other Topics Concern   Not on file  Social History Narrative   Not on file   Social Determinants of Health   Financial Resource Strain: Not  on file  Food Insecurity: Not on file  Transportation Needs: Not on file  Physical Activity: Not on file  Stress: Not on file  Social Connections: Not on file  Intimate Partner Violence: Not on file    No Known Allergies  No current facility-administered medications on file prior to encounter.   Current Outpatient Medications on File Prior to Encounter  Medication Sig Dispense Refill   Prenatal Vit-Fe Fumarate-FA (PRENATAL VITAMINS) 28-0.8 MG TABS Take 1 tablet by mouth daily. 30 tablet 11   acetaminophen (TYLENOL) 325 MG tablet Take 2 tablets (650 mg total) by mouth every 4 (four) hours as needed (for pain scale < 4). (Patient not taking: Reported on 11/25/2020)     Drospirenone (SLYND) 4 MG TABS Take 1 tablet by mouth daily. (Patient not taking: Reported on 11/25/2020) 28 tablet 1   ibuprofen (ADVIL) 600 MG tablet Take 1 tablet (600 mg total)  by mouth every 6 (six) hours. (Patient not taking: Reported on 12/23/2020) 30 tablet 0     ROS Pertinent positives and negative per HPI, all others reviewed and negative  Physical Exam   BP 121/80   Pulse 80   Temp 97.9 F (36.6 C) (Oral)   Resp 20   Ht 5' 5.5" (1.664 m)   Wt 59.8 kg   LMP 05/08/2021 (Approximate)   SpO2 99%   BMI 21.62 kg/m   Patient Vitals for the past 24 hrs:  BP Temp Temp src Pulse Resp SpO2 Height Weight  07/20/21 1840 121/80 -- -- 80 -- -- -- --  07/20/21 1803 119/82 97.9 F (36.6 C) Oral 85 20 99 % -- --  07/20/21 1755 -- -- -- -- -- -- 5' 5.5" (1.664 m) 59.8 kg    Physical Exam Vitals reviewed.  Constitutional:      General: She is not in acute distress.    Appearance: She is well-developed. She is not diaphoretic.  Eyes:     General: No scleral icterus. Pulmonary:     Effort: Pulmonary effort is normal. No respiratory distress.  Abdominal:     General: There is no distension.     Palpations: Abdomen is soft.     Tenderness: There is no abdominal tenderness. There is no guarding or rebound.   Skin:    General: Skin is warm and dry.  Neurological:     Mental Status: She is alert.     Coordination: Coordination normal.      Cervical Exam    Bedside Ultrasound Pt informed that the ultrasound is considered a limited OB ultrasound and is not intended to be a complete ultrasound exam.  Patient also informed that the ultrasound is not being completed with the intent of assessing for fetal or placental anomalies or any pelvic abnormalities.  Explained that the purpose of today's ultrasound is to assess for  viability.  Patient acknowledges the purpose of the exam and the limitations of the study.    My interpretation: IUGS seen, no other structures    Labs Results for orders placed or performed during the hospital encounter of 07/20/21 (from the past 24 hour(s))  Urinalysis, Routine w reflex microscopic Urine, Clean Catch     Status: Abnormal   Collection Time: 07/20/21  6:27 PM  Result Value Ref Range   Color, Urine YELLOW YELLOW   APPearance CLEAR CLEAR   Specific Gravity, Urine 1.021 1.005 - 1.030   pH 5.0 5.0 - 8.0   Glucose, UA NEGATIVE NEGATIVE mg/dL   Hgb urine dipstick MODERATE (A) NEGATIVE   Bilirubin Urine NEGATIVE NEGATIVE   Ketones, ur NEGATIVE NEGATIVE mg/dL   Protein, ur NEGATIVE NEGATIVE mg/dL   Nitrite NEGATIVE NEGATIVE   Leukocytes,Ua SMALL (A) NEGATIVE   RBC / HPF 6-10 0 - 5 RBC/hpf   WBC, UA 6-10 0 - 5 WBC/hpf   Bacteria, UA FEW (A) NONE SEEN   Squamous Epithelial / LPF 0-5 0 - 5   Mucus PRESENT     Imaging US OB LESS THAN 14 WEEKS WITH OB TRANSVAGINAL  Result Date: 07/20/2021 CLINICAL DATA:  Vaginal bleeding EXAM: OBSTETRIC <14 WK US AND TRANSVAGINAL OB US TECHNIQUE: Both transabdominal and transvaginal ultrasound examinations were performed for complete evaluation of the gestation as well as the maternal uterus, adnexal regions, and pelvic cul-de-sac. Transvaginal technique was performed to assess early pregnancy. COMPARISON:  None Available.  FINDINGS: Intrauterine gestational sac: Saccular fluid collection visualized within the  lower uterine segment. Yolk sac:  Not seen Embryo:  Not seen MSD: 3.5 mm   5 w   1 d Subchorionic hemorrhage:  None visualized. Maternal uterus/adnexae: Ovaries are within normal limits. The left ovary measures 2.9 x 1.2 x 1.4 cm. The right ovary measures 2.1 by 3 x 2.4 cm. No significant free fluid IMPRESSION: 1. Small saccular fluid collection within the uterus, possibly representing a small gestational sac but appears low in position at the lower uterine segment. No yolk sac or embryo is visualized. Recommend trending of HCG with possible short-term ultrasound follow-up. Electronically Signed   By: Jasmine Pang M.D.   On: 07/20/2021 19:58    MAU Course  Procedures Lab Orders         Urinalysis, Routine w reflex microscopic Urine, Clean Catch         hCG, quantitative, pregnancy    No orders of the defined types were placed in this encounter.  Imaging Orders         US OB LESS THAN 14 WEEKS WITH OB TRANSVAGINAL     MDM moderate  Assessment and Plan  #Vaginal bleeding in pregnancy, first trimester #Abdominal pain in pregnancy, first trimester #Pregnancy of unknown location #4-[redacted] weeks gestation of pregnancy US shows IUGS, no other findings. Technically PUL but overall more likely IUP. Discussed with patient viability uncertain, will need to trend hcg. --/--/A POS (09/13 2124), rhogam not indicated. Patient to follow up in two days for repeat hcg.    Dispo: discharged to home in stable condition.   Venora Maples, MD/MPH 07/20/21 8:25 PM  Allergies as of 07/20/2021   No Known Allergies      Medication List     STOP taking these medications    acetaminophen 325 MG tablet Commonly known as: Tylenol   ibuprofen 600 MG tablet Commonly known as: ADVIL   Slynd 4 MG Tabs Generic drug: Drospirenone       TAKE these medications    Prenatal Vitamins 28-0.8 MG Tabs Take 1 tablet by  mouth daily.

## 2021-07-20 NOTE — MAU Note (Signed)
Janet Rodriguez is a 31 y.o. at [redacted]w[redacted]d here in MAU reporting: bright red VB that began yesterday.  Reports VB has increased, denies clots.  Denies abdominal pain/cramping, but endorses mild lower back pain.  Denies intercourse since last weekend.  Onset of complaint: yesterday Pain score: 4/10 back pain Vitals:   07/20/21 1803  BP: 119/82  Pulse: 85  Resp: 20  Temp: 97.9 F (36.6 C)  SpO2: 99%     [place]:[id number], not heard Lab orders placed from triage:   UA

## 2021-07-20 NOTE — Discharge Instructions (Signed)
You were seen in the MAU for vaginal bleeding. We did an ultrasound which showed a gestational sac, which is seen in very early pregnancy. This means you are likely only 4-[redacted] weeks pregnant. At this point we need to trend your hormone levels to know if this is a healthy pregnancy or not. You should return to the MAU on Saturday June 10 around 5 PM for a repeat blood draw of your hormone level. You can stay to hear the results or ask to be called.

## 2021-07-22 ENCOUNTER — Other Ambulatory Visit: Payer: Self-pay

## 2021-07-22 ENCOUNTER — Inpatient Hospital Stay (HOSPITAL_COMMUNITY)
Admission: AD | Admit: 2021-07-22 | Discharge: 2021-07-22 | Disposition: A | Discharge status: Home / Self Care | Payer: Medicaid Other | Attending: Obstetrics and Gynecology | Admitting: Obstetrics and Gynecology

## 2021-07-22 ENCOUNTER — Inpatient Hospital Stay (HOSPITAL_COMMUNITY): Payer: Medicaid Other

## 2021-07-22 DIAGNOSIS — O3680X0 Pregnancy with inconclusive fetal viability, not applicable or unspecified: Secondary | ICD-10-CM | POA: Diagnosis not present

## 2021-07-22 DIAGNOSIS — Z3A10 10 weeks gestation of pregnancy: Secondary | ICD-10-CM | POA: Diagnosis not present

## 2021-07-22 DIAGNOSIS — Z09 Encounter for follow-up examination after completed treatment for conditions other than malignant neoplasm: Secondary | ICD-10-CM

## 2021-07-22 LAB — HCG, QUANTITATIVE, PREGNANCY: hCG, Beta Chain, Quant, S: 625 m[IU]/mL — ABNORMAL HIGH (ref <5)

## 2021-07-22 NOTE — MAU Note (Signed)
Pt her for repeat BHCG. Denies any pain reports bleeding/spotting the same.

## 2021-07-22 NOTE — MAU Provider Note (Signed)
History   Chief Complaint:  Follow-up   Janet Rodriguez is  31 y.o. [id number] Patient's last menstrual period was 05/08/2021 (approximate).. Patient is here for follow up of quantitative HCG and ongoing surveillance of pregnancy status. She is [redacted]w[redacted]d weeks gestation  by LMP.     Since her last visit, the patient is without new complaint. The patient reports bleeding as  spotting.  She denies any pain.   General ROS:  negative  Her previous Quantitative HCG values are:  6/8: 625  Physical Exam   Blood pressure 118/77, pulse 72, temperature 98.8 F (37.1 C), resp. rate 18, last menstrual period 05/08/2021, not currently breastfeeding.   Physical Exam Vitals and nursing note reviewed.  Constitutional:      General: She is not in acute distress.    Appearance: She is well-developed.  HENT:     Head: Normocephalic.  Eyes:     Pupils: Pupils are equal, round, and reactive to light.  Cardiovascular:     Rate and Rhythm: Normal rate and regular rhythm.  Pulmonary:     Effort: Pulmonary effort is normal. No respiratory distress.     Breath sounds: Normal breath sounds.  Abdominal:     Palpations: Abdomen is soft.     Tenderness: There is no abdominal tenderness.  Musculoskeletal:        General: Normal range of motion.     Cervical back: Normal range of motion.  Skin:    General: Skin is warm and dry.  Neurological:     Mental Status: She is alert and oriented to person, place, and time.  Psychiatric:        Behavior: Behavior normal.        Thought Content: Thought content normal.        Judgment: Judgment normal.       Labs: Results for orders placed or performed during the hospital encounter of 07/22/21 (from the past 24 hour(s))  hCG, quantitative, pregnancy   Collection Time: 07/22/21  5:02 PM  Result Value Ref Range   hCG, Beta Chain, Quant, S 625 (H) <5 mIU/mL   Patient states she was told by previous MD that she did not have to wait for results. CNM  requested that patient stay and wait so that CNM could discuss results in person and makes decisions for plan of care if results are abnormal. Patient states she is not going to wait. CNM will call with results.   Assessment:   1. Pregnancy of unknown anatomic location   2. [redacted] weeks gestation of pregnancy   3. Follow-up exam     CNM called patient and verified with DOB. CNM explained results of plateau in HCG and indication that this could be ectopic pregnancy. CNM reviewed recommendation for repeat HCG on Monday.   Patient very upset over the phone and states she does not understand how this could be an ectopic because she was told she had a gestational sac in her uterus. CNM read results of imaging out loud to patient which states it is "Small saccular fluid collection within the uterus, possibly representing a small gestational sac but appears low in position at the lower uterine segment. No yolk sac or embryo is visualized" but recommended repeat HCG. Given that HCG plateau, this is more suspicious for ectopic or abnormally developing pregnancy. Discussed with patient that HCG in normal pregnancy will approximately double every 48 hours and fall if miscarriage.   Patient questioned why the ectopic wasn't seen  on ultrasound. CNM explained that early ectopics cannot always be seen on ultrasound and this is why we do close surveillance of pregnancy. Patient verbalized frustration and repeats that she does not understand why she was told it was in her uterus if we did not know and needed her to come back. CNM verbalized understanding that this is confusing and attempted to explain again the monitoring of the pregnancy.   CNM asked if patient had any questions she states "no I'm not dumb but I'm not coming back on Monday." CNM emphasized importance of returning to MAU for repeat HCG because ectopic pregnancies are life threatening and can result in maternal death if not managed properly. CNM discussed  importance of staying in MAU on Monday after blood work so that if hormone levels are abnormal again, the team can discuss treatment for the ectopic pregnancy if needed. Patient asked what time to come and CNM instructed patient to come around 1700. Patient then hung up on CNM.  Plan: -Discharge home in stable condition -Strict ectopic precautions discussed -Patient advised to follow-up with MAU on Monday for repeat HCG, no office appointments and high suspicion for ectopic pregnancy.  -Labs placed in signed and held.  -Patient may return to MAU as needed or if her condition were to change or worsen  Rolm Bookbinder, CNM 07/22/2021, 5:25 PM

## 2021-07-22 NOTE — MAU Note (Incomplete)
Pt was told by MD on Thursday she did not need to stay for result. Informed her that if results come back different than expected that she may need to come back for further evaluation. Pt agreeable to that. Notified C. NEill,CNM of pt decision to go home to wait for results.

## 2021-07-24 ENCOUNTER — Other Ambulatory Visit: Payer: Self-pay

## 2021-07-24 ENCOUNTER — Inpatient Hospital Stay (HOSPITAL_COMMUNITY)
Admission: AD | Admit: 2021-07-24 | Discharge: 2021-07-24 | Disposition: A | Discharge status: Home / Self Care | Payer: Medicaid Other | Attending: Obstetrics and Gynecology | Admitting: Obstetrics and Gynecology

## 2021-07-24 ENCOUNTER — Other Ambulatory Visit (HOSPITAL_COMMUNITY): Payer: Medicaid Other

## 2021-07-24 ENCOUNTER — Other Ambulatory Visit: Payer: Medicaid Other

## 2021-07-24 DIAGNOSIS — O3680X0 Pregnancy with inconclusive fetal viability, not applicable or unspecified: Secondary | ICD-10-CM

## 2021-07-24 DIAGNOSIS — O021 Missed abortion: Secondary | ICD-10-CM | POA: Diagnosis not present

## 2021-07-24 DIAGNOSIS — O039 Complete or unspecified spontaneous abortion without complication: Secondary | ICD-10-CM | POA: Diagnosis present

## 2021-07-24 LAB — COMPREHENSIVE METABOLIC PANEL
ALT: 9 U/L (ref 0–44)
AST: 15 U/L (ref 15–41)
Albumin: 4.3 g/dL (ref 3.5–5.0)
Alkaline Phosphatase: 44 U/L (ref 38–126)
Anion gap: 8 (ref 5–15)
BUN: 8 mg/dL (ref 6–20)
CO2: 26 mmol/L (ref 22–32)
Calcium: 9.8 mg/dL (ref 8.9–10.3)
Chloride: 104 mmol/L (ref 98–111)
Creatinine, Ser: 0.89 mg/dL (ref 0.44–1.00)
GFR, Estimated: >60 mL/min (ref >60)
Glucose, Bld: 107 mg/dL — ABNORMAL HIGH (ref 70–99)
Potassium: 3.7 mmol/L (ref 3.5–5.1)
Sodium: 138 mmol/L (ref 135–145)
Total Bilirubin: 0.4 mg/dL (ref 0.3–1.2)
Total Protein: 7.4 g/dL (ref 6.5–8.1)

## 2021-07-24 LAB — CBC
HCT: 42.2 % (ref 36.0–46.0)
Hemoglobin: 14 g/dL (ref 12.0–15.0)
MCH: 30.7 pg (ref 26.0–34.0)
MCHC: 33.2 g/dL (ref 30.0–36.0)
MCV: 92.5 fL (ref 80.0–100.0)
Platelets: 387 10*3/uL (ref 150–400)
RBC: 4.56 MIL/uL (ref 3.87–5.11)
RDW: 14 % (ref 11.5–15.5)
WBC: 8.2 10*3/uL (ref 4.0–10.5)
nRBC: 0 % (ref 0.0–0.2)

## 2021-07-24 LAB — HCG, QUANTITATIVE, PREGNANCY: hCG, Beta Chain, Quant, S: 562 m[IU]/mL — ABNORMAL HIGH (ref <5)

## 2021-07-24 NOTE — MAU Note (Signed)
Janet Rodriguez is a 31 y.o. at [redacted]w[redacted]d here in MAU reporting: here for repeat HCG level.  Denies pain, still bleeding- unchanged in amt.  Is not soaking pad, bright red when she wipes. No blots.    Pain score: none Vitals:   07/24/21 1742  BP: (!) 109/56  Pulse: 80  Resp: 17  Temp: 98.2 F (36.8 C)  SpO2: 100%      Lab orders placed from triage:  blood work has been drawn

## 2021-07-24 NOTE — MAU Provider Note (Signed)
[name] is a 31 y.o. [phone] pregnant female at [redacted]w[redacted]d who presents to MAU today for follow up HCG. Was seen on 6/8 and 6/10 for vaginal spotting and was diagnosed with a pregnancy of unknown location. Strongly desires not to wait for results but understands necessity due to possibility of ectopic or threatened miscarriage.  Reviewed previous MAU notes. Has received GYN care from [facility].  Pertinent items noted in HPI and remainder of comprehensive ROS otherwise negative.   O BP (!) 109/56 (BP Location: Right Arm)   Pulse 80   Temp 98.2 F (36.8 C) (Oral)   Resp 17   Ht 5' 5.5" (1.664 m)   Wt 131 lb 8 oz (59.6 kg)   LMP 05/08/2021 (Approximate)   SpO2 100%   BMI 21.55 kg/m  Physical Exam Vitals and nursing note reviewed.  Constitutional:      General: She is not in acute distress.    Appearance: Normal appearance. She is not ill-appearing.  Cardiovascular:     Rate and Rhythm: Normal rate and regular rhythm.  Pulmonary:     Effort: Pulmonary effort is normal.  Musculoskeletal:        General: Normal range of motion.  Skin:    General: Skin is warm and dry.     Capillary Refill: Capillary refill takes less than 2 seconds.  Neurological:     Mental Status: She is alert and oriented to person, place, and time.  Psychiatric:        Thought Content: Thought content normal.     Comments: Pt visibly upset by results but carefully controlling her reaction to avoid crying.     Results for orders placed or performed during the hospital encounter of 07/24/21 (from the past 24 hour(s))  CBC     Status: None   Collection Time: 07/24/21  5:17 PM  Result Value Ref Range   WBC 8.2 4.0 - 10.5 K/uL   RBC 4.56 3.87 - 5.11 MIL/uL   Hemoglobin 14.0 12.0 - 15.0 g/dL   HCT 56.9 79.4 - 80.1 %   MCV 92.5 80.0 - 100.0 fL   MCH 30.7 26.0 - 34.0 pg   MCHC 33.2 30.0 - 36.0 g/dL   RDW 65.5 37.4 - 82.7 %   Platelets 387 150 - 400 K/uL   nRBC 0.0 0.0 - 0.2 %  hCG, quantitative,  pregnancy     Status: Abnormal   Collection Time: 07/24/21  5:17 PM  Result Value Ref Range   hCG, Beta Chain, Quant, S 562 (H) <5 mIU/mL  Comprehensive metabolic panel     Status: Abnormal   Collection Time: 07/24/21  5:17 PM  Result Value Ref Range   Sodium 138 135 - 145 mmol/L   Potassium 3.7 3.5 - 5.1 mmol/L   Chloride 104 98 - 111 mmol/L   CO2 26 22 - 32 mmol/L   Glucose, Bld 107 (H) 70 - 99 mg/dL   BUN 8 6 - 20 mg/dL   Creatinine, Ser 0.78 0.44 - 1.00 mg/dL   Calcium 9.8 8.9 - 67.5 mg/dL   Total Protein 7.4 6.5 - 8.1 g/dL   Albumin 4.3 3.5 - 5.0 g/dL   AST 15 15 - 41 U/L   ALT 9 0 - 44 U/L   Alkaline Phosphatase 44 38 - 126 U/L   Total Bilirubin 0.4 0.3 - 1.2 mg/dL   GFR, Estimated >44 >92 mL/min   Anion gap 8 5 - 15   MDM/MAU  Course: Reviewed today's results and previous results/ultrasound with Dr. Adrian Blackwater who agreed the saccular structure in the lower uterine segment was a gestational sac and her results today are consistent with a failed pregnancy.  Pt brought to family room and given results with condolences. 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 Strongly encouraged her to present for SAB follow up at [facility] and request a workup for repeated losses. Pt agreed, declined cytotec therapy, just wants expectant management.  A Miscarriage Medical screening exam complete  P Discharge from MAU in stable condition with bleeding precautions Follow up at [facility] in one week for HCG and two weeks for provider visit   Bernerd Limbo, CNM 07/24/2021 6:41 PM

## 2021-07-25 LAB — COMPREHENSIVE METABOLIC PANEL
ALT: 5 IU/L (ref 0–32)
AST: 12 IU/L (ref 0–40)
Albumin/Globulin Ratio: 1.8 (ref 1.2–2.2)
Albumin: 4.8 g/dL (ref 3.8–4.8)
Alkaline Phosphatase: 56 IU/L (ref 44–121)
BUN/Creatinine Ratio: 15 (ref 9–23)
BUN: 11 mg/dL (ref 6–20)
Bilirubin Total: 0.3 mg/dL (ref 0.0–1.2)
CO2: 22 mmol/L (ref 20–29)
Calcium: 9.8 mg/dL (ref 8.7–10.2)
Chloride: 102 mmol/L (ref 96–106)
Creatinine, Ser: 0.73 mg/dL (ref 0.57–1.00)
Globulin, Total: 2.6 g/dL (ref 1.5–4.5)
Glucose: 93 mg/dL (ref 70–99)
Potassium: 4.2 mmol/L (ref 3.5–5.2)
Sodium: 141 mmol/L (ref 134–144)
Total Protein: 7.4 g/dL (ref 6.0–8.5)
eGFR: 111 mL/min/{1.73_m2} (ref >59)

## 2021-07-25 LAB — CBC
Hematocrit: 43.2 % (ref 34.0–46.6)
Hemoglobin: 14.3 g/dL (ref 11.1–15.9)
MCH: 30.7 pg (ref 26.6–33.0)
MCHC: 33.1 g/dL (ref 31.5–35.7)
MCV: 93 fL (ref 79–97)
Platelets: 356 10*3/uL (ref 150–450)
RBC: 4.66 x10E6/uL (ref 3.77–5.28)
RDW: 13 % (ref 11.7–15.4)
WBC: 6.3 10*3/uL (ref 3.4–10.8)

## 2021-07-25 LAB — BETA HCG QUANT (REF LAB): hCG Quant: 469 m[IU]/mL

## 2021-08-01 ENCOUNTER — Other Ambulatory Visit: Payer: Medicaid Other

## 2021-08-01 DIAGNOSIS — O209 Hemorrhage in early pregnancy, unspecified: Secondary | ICD-10-CM

## 2021-08-02 LAB — BETA HCG QUANT (REF LAB): hCG Quant: 110 m[IU]/mL

## 2021-08-09 ENCOUNTER — Encounter: Payer: Self-pay | Admitting: Obstetrics and Gynecology

## 2021-08-09 ENCOUNTER — Ambulatory Visit (INDEPENDENT_AMBULATORY_CARE_PROVIDER_SITE_OTHER): Payer: Medicaid Other | Admitting: Obstetrics and Gynecology

## 2021-08-09 VITALS — BP 105/71 | HR 64 | Wt 134.0 lb

## 2021-08-09 DIAGNOSIS — O039 Complete or unspecified spontaneous abortion without complication: Secondary | ICD-10-CM

## 2021-08-09 NOTE — Progress Notes (Signed)
Pt is in office for SAB follow up. Pt would like to conceive, please discuss.

## 2021-08-09 NOTE — Progress Notes (Signed)
31 yo [id number] here for MAU follow up following recent diagnosis of spontaneous miscarriage on 07/24/21. Patient reports doing well with resolution of vaginal bleeding. Patient is without any complaints. She plans to conceive again. 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   Past Medical History:  Diagnosis Date   BV (bacterial vaginosis)    Headache(784.0)    History of pelvic fracture    Infection    Past Surgical History:  Procedure Laterality Date   ROOT CANAL     Family History  Problem Relation Age of Onset   Cancer Maternal Grandfather    Cancer Paternal Grandmother    Healthy Mother    Healthy Father    Social History   Tobacco Use   Smoking status: Former    Packs/day: 0.25    Years: 10.00    Total pack years: 2.50    Types: Cigarettes    Quit date: 10/28/2011    Years since quitting: 9.7   Smokeless tobacco: Never   Tobacco comments:    NO longer smoking   Vaping Use   Vaping Use: Never used  Substance Use Topics   Alcohol use: Not Currently    Comment: occ   Drug use: No   ROS See pertinent in HPI. All other systems reviewed and non contributory Blood pressure 105/71, pulse 64, weight 134 lb (60.8 kg), last menstrual period 05/08/2021, not currently breastfeeding.  GENERAL: Well-developed, well-nourished female in no acute distress.  NEURO: alert and oriented x 3  A/P 31 yo with recent spontaneous miscarriage - quant HCG today with TSH and A1c - patient encouraged to continue taking prenatal vitamins - Patient current on pap smear - normal in 2022

## 2021-08-10 LAB — BETA HCG QUANT (REF LAB): hCG Quant: 2 m[IU]/mL

## 2021-08-10 LAB — TSH: TSH: 1.11 u[IU]/mL (ref 0.450–4.500)

## 2021-08-18 ENCOUNTER — Other Ambulatory Visit: Payer: Medicaid Other

## 2021-11-01 IMAGING — US US OB LIMITED
1 series · 5 of 5 positions shown · non-contrast
Comparison: none

[Series 1: us ob limited · 5 of 5 slices shown]
[im 1/5]
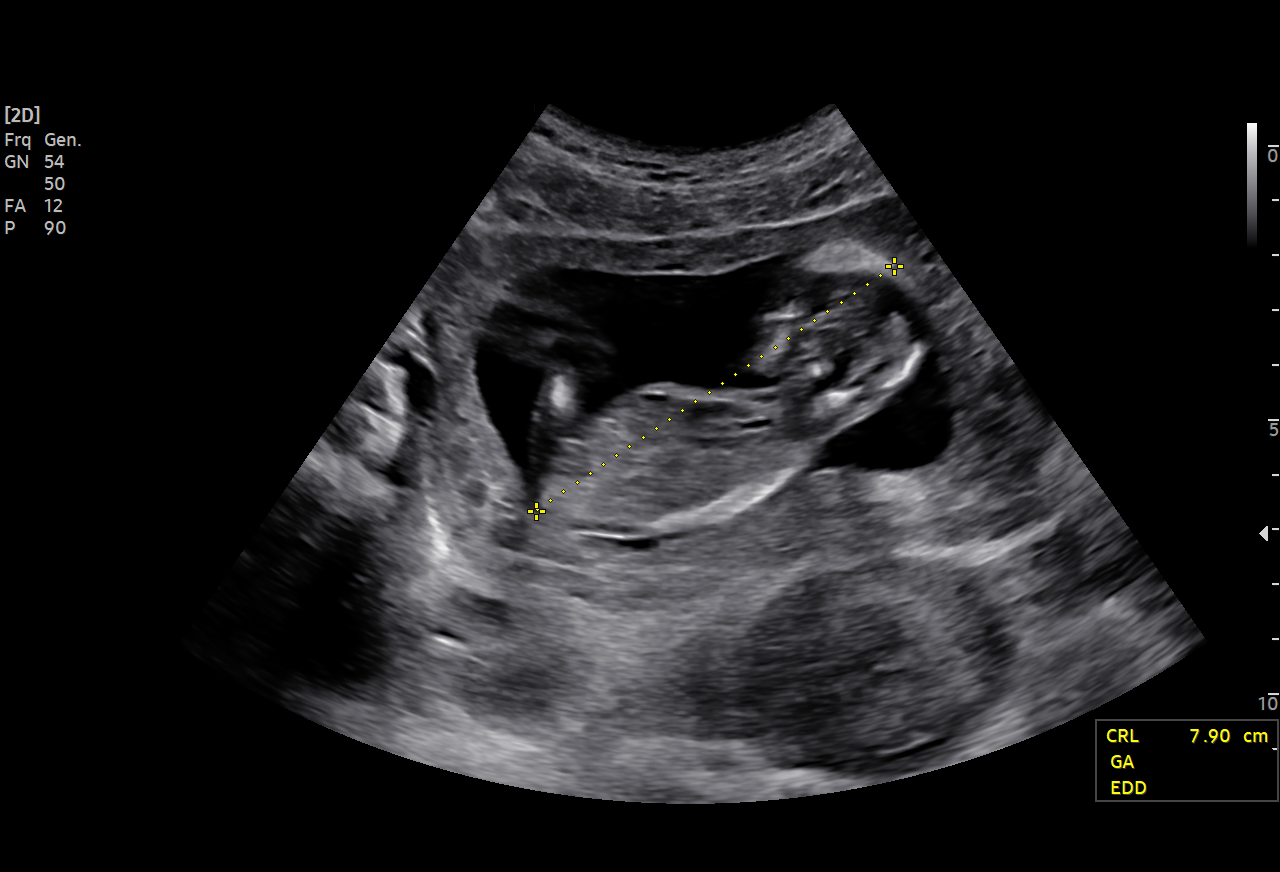
[im 2/5]
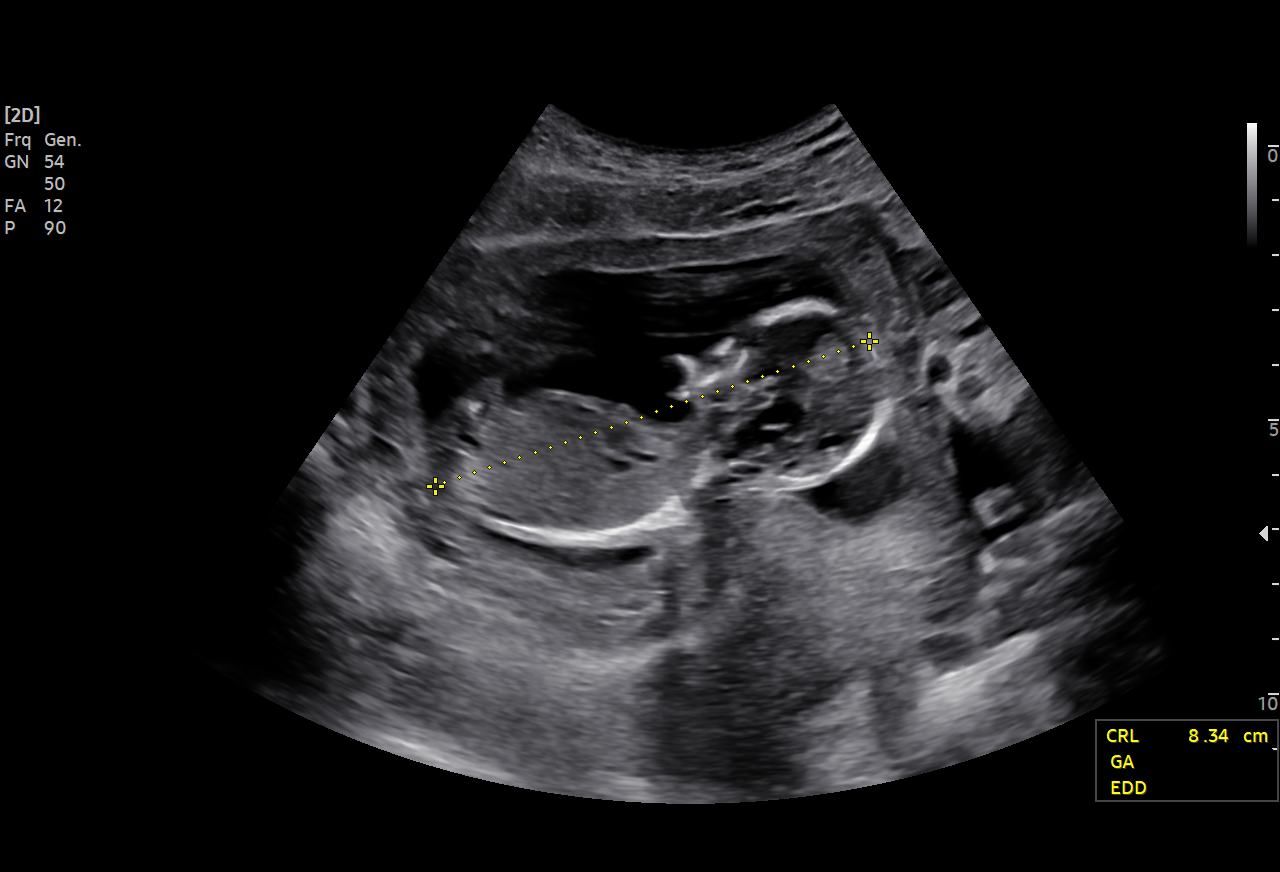
[im 3/5]
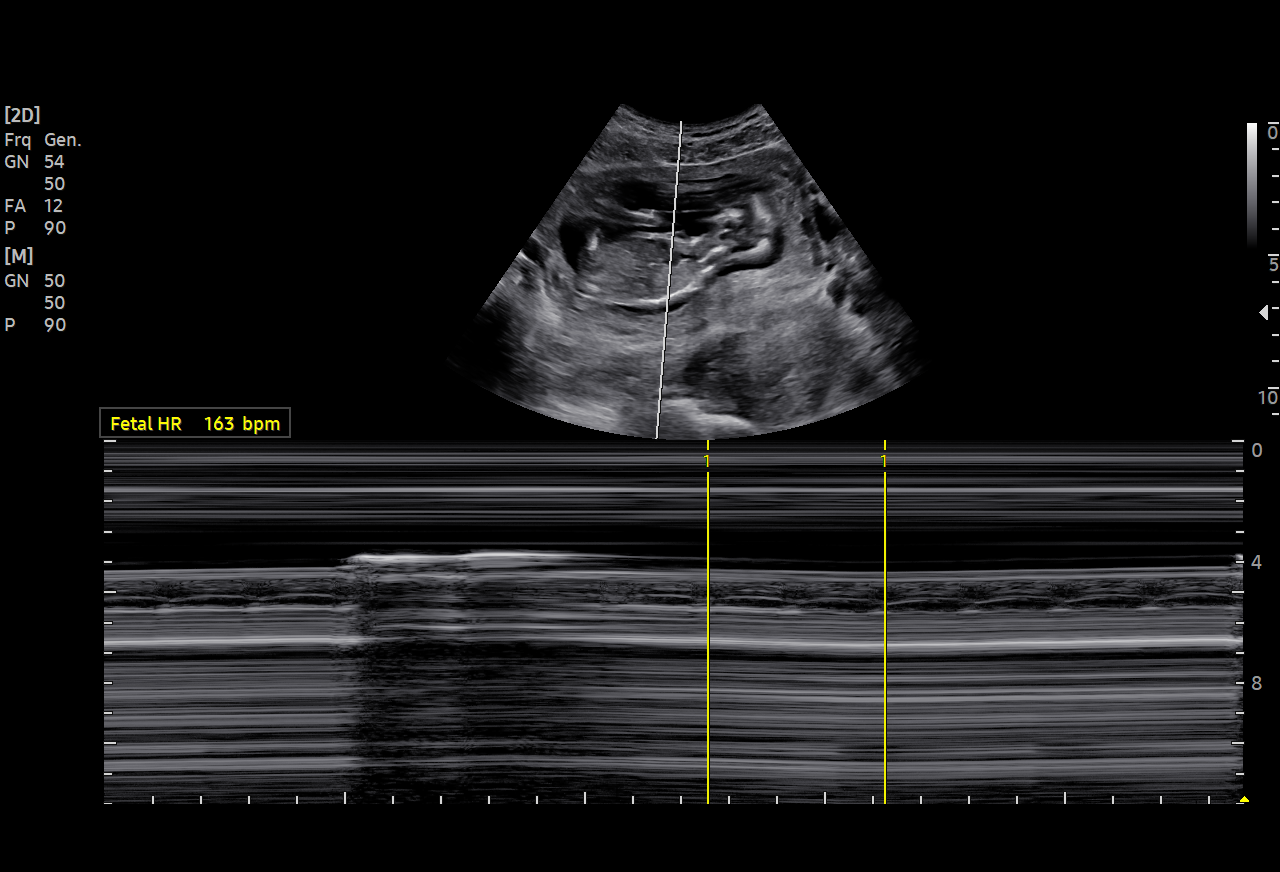
[im 4/5]
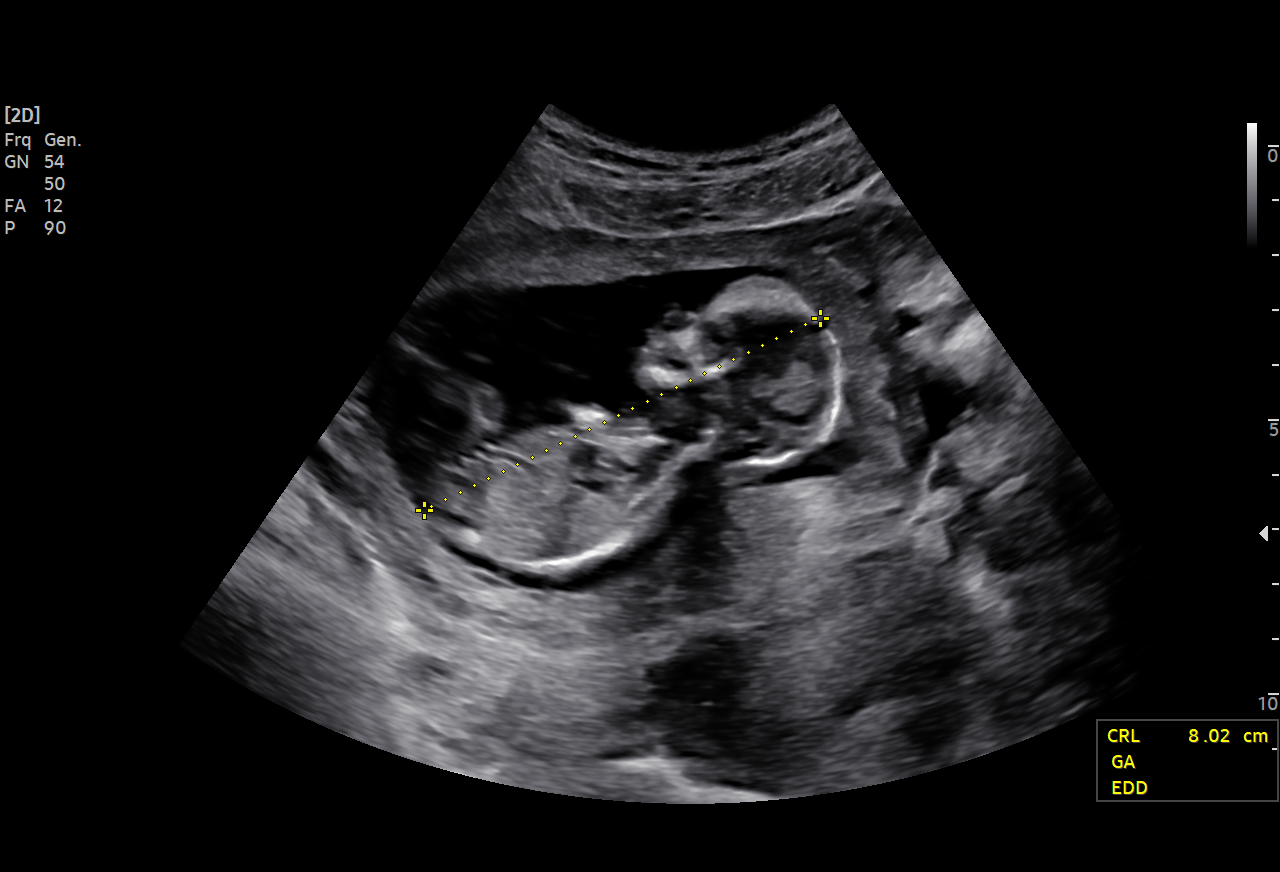
[im 5/5]
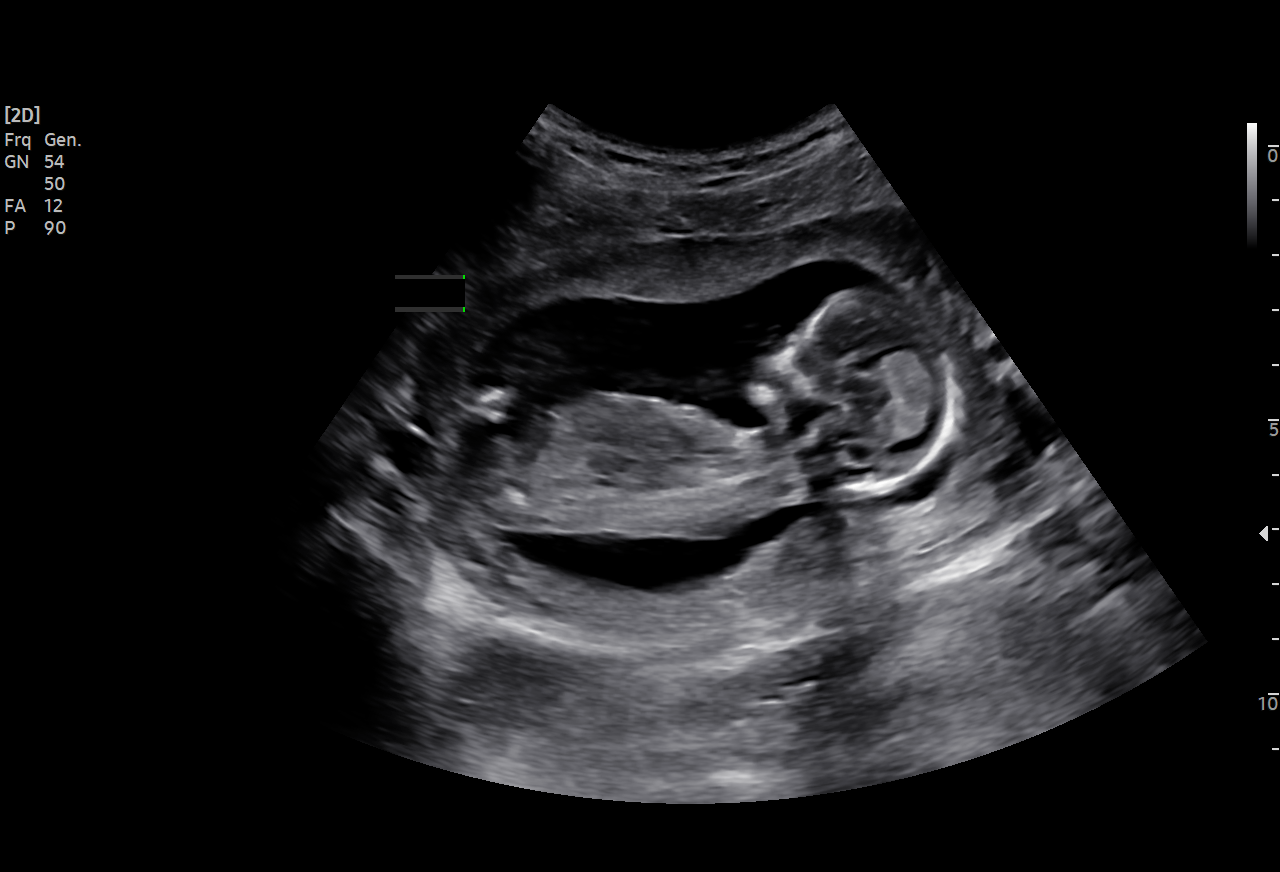

[5 of 5 positions shown; findings below may reference images not displayed]

Healthcare

 1   [HOSPITAL]                        [address].0      ESMY GILMAN

Indications

 13 weeks gestation of pregnancy
Fetal Evaluation

 Num Of Fetuses:          1
 Fetal Heart Rate(bpm):   163
 Cardiac Activity:        Observed
Biometry

 CRL:      80.8   mm     G. Age:  13w 5d                   EDD:   10/29/20
Gestational Age

 Best:           13w 5d    Det. By:  U/S C R L (04/28/20)       EDD:  10/29/20
Comments

 Single live IUP at 58w9d by CRL. Dates are not consistent with
 LMP.
Impression

 Viable intrauterine pregnancy
Recommendations

 Routine prenatal care
                 Bacorisen, Davissen

## 2022-11-22 ENCOUNTER — Ambulatory Visit (HOSPITAL_COMMUNITY)
Admission: EM | Admit: 2022-11-22 | Discharge: 2022-11-22 | Disposition: A | Discharge status: Home / Self Care | Payer: Medicaid Other | Attending: Emergency Medicine | Admitting: Emergency Medicine

## 2022-11-22 ENCOUNTER — Encounter (HOSPITAL_COMMUNITY): Payer: Self-pay

## 2022-11-22 DIAGNOSIS — Z202 Contact with and (suspected) exposure to infections with a predominantly sexual mode of transmission: Secondary | ICD-10-CM | POA: Insufficient documentation

## 2022-11-22 LAB — HIV ANTIBODY (ROUTINE TESTING W REFLEX): HIV Screen 4th Generation wRfx: NONREACTIVE

## 2022-11-22 MED ORDER — DOXYCYCLINE HYCLATE 100 MG PO CAPS: 100.0000 mg | ORAL_CAPSULE | Freq: Two times a day (BID) | ORAL | 0 refills | Status: AC

## 2022-11-22 MED ORDER — METRONIDAZOLE 500 MG PO TABS: 500.0000 mg | ORAL_TABLET | Freq: Two times a day (BID) | ORAL | 0 refills | Status: DC

## 2022-11-22 NOTE — ED Provider Notes (Signed)
[facility]    CSN: [id number] Arrival date & time: 11/22/22  1117      History   Chief Complaint No chief complaint on file.   HPI DELLE Rodriguez is a 32 y.o. female.   Patient presents with concerns of STIs. She states her husband just found out he tested positive for chlamydia and trich. She states they have unprotected intercourse since they are married so she wants to get tested and go ahead and get treated. The patient also requests bloodwork for HIV and syphilis. She denies any symptoms including discharge, dysuria, abdominal or back pain.   The history is provided by the patient.    Past Medical History:  Diagnosis Date   BV (bacterial vaginosis)    Headache(784.0)    History of pelvic fracture    Infection     Patient Active Problem List   Diagnosis Date Noted   Erroneous encounter - disregard 12/26/2020   Indication for care in labor and delivery, antepartum 10/25/2020   COVID-19 affecting pregnancy in third trimester 08/10/2020   Supervision of normal pregnancy, antepartum 03/30/2020    Past Surgical History:  Procedure Laterality Date   ROOT CANAL     TONSILLECTOMY      OB History     Gravida  6   Para  3   Term  3   Preterm      AB  2   Living  3      SAB  2   IAB      Ectopic      Multiple      Live Births  3            Home Medications    Prior to Admission medications   Medication Sig Start Date End Date Taking? Authorizing Provider  doxycycline (VIBRAMYCIN) 100 MG capsule Take 1 capsule (100 mg total) by mouth 2 (two) times daily for 7 days. 11/22/22 11/29/22 Yes Izayiah Tibbitts L, PA  metroNIDAZOLE (FLAGYL) 500 MG tablet Take 1 tablet (500 mg total) by mouth 2 (two) times daily. 11/22/22  Yes Samatha Anspach L, PA  Prenatal Vit-Fe Fumarate-FA (PRENATAL VITAMINS) 28-0.8 MG TABS Take 1 tablet by mouth daily. 07/13/21   Warden Fillers, MD    Family History Family History  Problem Relation Age of Onset    Cancer Maternal Grandfather    Cancer Paternal Grandmother    Healthy Mother    Healthy Father     Social History Social History   Tobacco Use   Smoking status: Former    Current packs/day: 0.00    Average packs/day: 0.3 packs/day for 10.0 years (2.5 ttl pk-yrs)    Types: Cigarettes    Start date: 10/27/2001    Quit date: 10/28/2011    Years since quitting: 11.0   Smokeless tobacco: Never   Tobacco comments:    NO longer smoking   Vaping Use   Vaping status: Every Day   Substances: Flavoring  Substance Use Topics   Alcohol use: Not Currently   Drug use: No     Allergies   Patient has no known allergies.   Review of Systems Review of Systems  Constitutional:  Negative for fever.  Gastrointestinal:  Negative for abdominal pain.  Genitourinary:  Negative for dysuria, flank pain, genital sores, hematuria and vaginal discharge.  Musculoskeletal:  Negative for back pain.  Skin:  Negative for rash.     Physical Exam Triage Vital Signs ED Triage Vitals  Encounter Vitals Group     BP 11/22/22 1139 107/73     Systolic BP Percentile --      Diastolic BP Percentile --      Pulse Rate 11/22/22 1139 67     Resp 11/22/22 1139 14     Temp 11/22/22 1139 98.2 F (36.8 C)     Temp Source 11/22/22 1139 Oral     SpO2 11/22/22 1139 97 %     Weight --      Height --      Head Circumference --      Peak Flow --      Pain Score 11/22/22 1141 0     Pain Loc --      Pain Education --      Exclude from Growth Chart --    No data found.  Updated Vital Signs BP 107/73 (BP Location: Right Arm)   Pulse 67   Temp 98.2 F (36.8 C) (Oral)   Resp 14   LMP 11/13/2022   SpO2 97%   Visual Acuity Right Eye Distance:   Left Eye Distance:   Bilateral Distance:    Right Eye Near:   Left Eye Near:    Bilateral Near:     Physical Exam Vitals and nursing note reviewed.  Constitutional:      General: She is not in acute distress. Cardiovascular:     Rate and Rhythm: Normal  rate and regular rhythm.     Heart sounds: Normal heart sounds.  Pulmonary:     Effort: Pulmonary effort is normal.     Breath sounds: Normal breath sounds.  Abdominal:     Palpations: Abdomen is soft.     Tenderness: There is no abdominal tenderness. There is no right CVA tenderness, left CVA tenderness or guarding.  Neurological:     Mental Status: She is alert.      UC Treatments / Results  Labs (all labs ordered are listed, but only abnormal results are displayed) Labs Reviewed  HIV ANTIBODY (ROUTINE TESTING W REFLEX)  RPR  CERVICOVAGINAL ANCILLARY ONLY    EKG   Radiology No results found.  Procedures Procedures (including critical care time)  Medications Ordered in UC Medications - No data to display  Initial Impression / Assessment and Plan / UC Course  I have reviewed the triage vital signs and the nursing notes.  Pertinent labs & imaging results that were available during my care of the patient were reviewed by me and considered in my medical decision making (see chart for details).     Empiric tx for chlamydia and trich given exposure. Labs sent out. Discussed abstinence until treatment completed - husband was treated today as well.   E/M: 1 acute uncomplicated illness, 3 data (GCT, HIV, RPR), moderate risk due to prescription management   Final Clinical Impressions(s) / UC Diagnoses   Final diagnoses:  Exposure to sexually transmitted disease (STD)     Discharge Instructions      Take medications as prescribed and finish full course. Make sure to take both medications - the doxycycline treats chlamydia and the metronidazole treats trich. We are sending out labs and will contact you with results. No intercourse until completed all medications.     ED Prescriptions     Medication Sig Dispense Auth. Provider   metroNIDAZOLE (FLAGYL) 500 MG tablet Take 1 tablet (500 mg total) by mouth 2 (two) times daily. 14 tablet Kmarion Rawl L, PA   doxycycline  (VIBRAMYCIN) 100  MG capsule Take 1 capsule (100 mg total) by mouth 2 (two) times daily for 7 days. 14 capsule Vallery Sa, Marquise Lambson L, PA      PDMP not reviewed this encounter.   Estanislado Pandy, [place] 11/22/22 1229

## 2022-11-22 NOTE — Discharge Instructions (Addendum)
Take medications as prescribed and finish full course. Make sure to take both medications - the doxycycline treats chlamydia and the metronidazole treats trich. We are sending out labs and will contact you with results. No intercourse until completed all medications.

## 2022-11-22 NOTE — ED Triage Notes (Signed)
Patient reports that her husband tested positive for Chlamydia and trichomonas. Patient denies having any symptoms.

## 2022-11-23 LAB — CERVICOVAGINAL ANCILLARY ONLY
Chlamydia: NEGATIVE
Comment: NEGATIVE
Comment: NEGATIVE
Comment: NORMAL
Neisseria Gonorrhea: NEGATIVE
Trichomonas: POSITIVE — AB

## 2022-11-23 LAB — RPR: RPR Ser Ql: NONREACTIVE

## 2023-01-23 IMAGING — US US OB < 14 WEEKS - US OB TV
1 series · 15 of 28 positions shown · non-contrast
Comparison: None Available.

CLINICAL DATA: Vaginal bleeding

EXAM:
OBSTETRIC <14 WK US AND TRANSVAGINAL OB US
TECHNIQUE: Both transabdominal and transvaginal ultrasound examinations were
performed for complete evaluation of the gestation as well as the
maternal uterus, adnexal regions, and pelvic cul-de-sac.
Transvaginal technique was performed to assess early pregnancy.

[Series 1: us ob < 14 weeks - us ob tv · 15 of 51 slices shown]
[im 1/51]
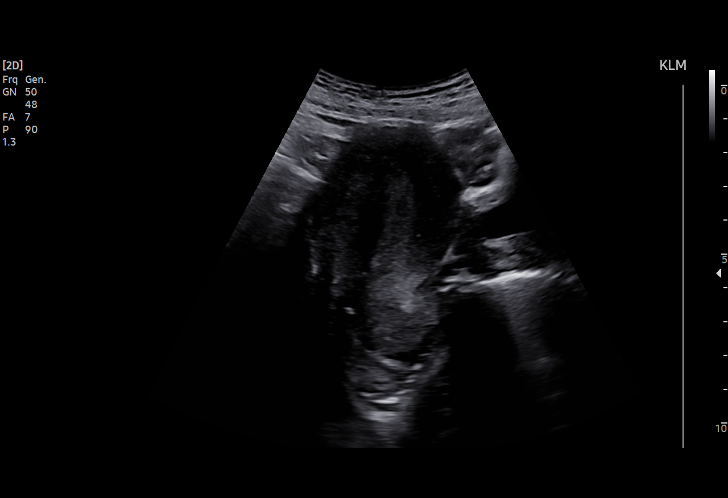
[im 4/51]
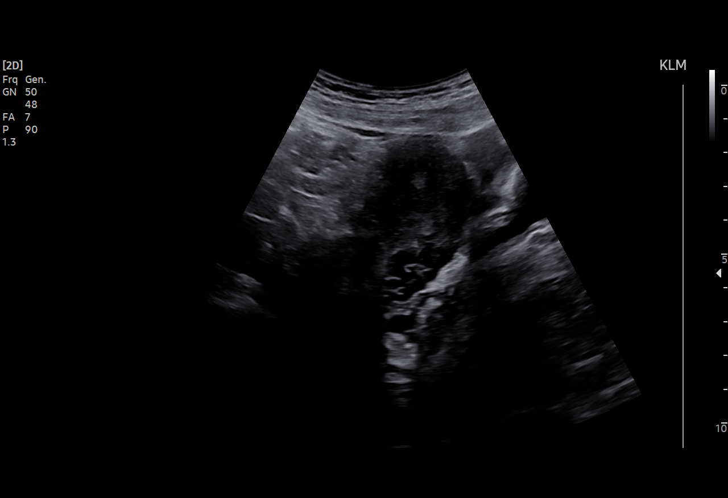
[im 8/51]
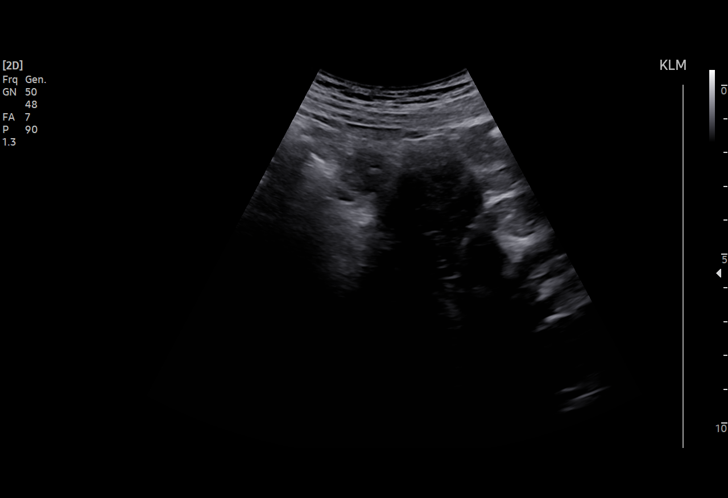
[im 12/51]
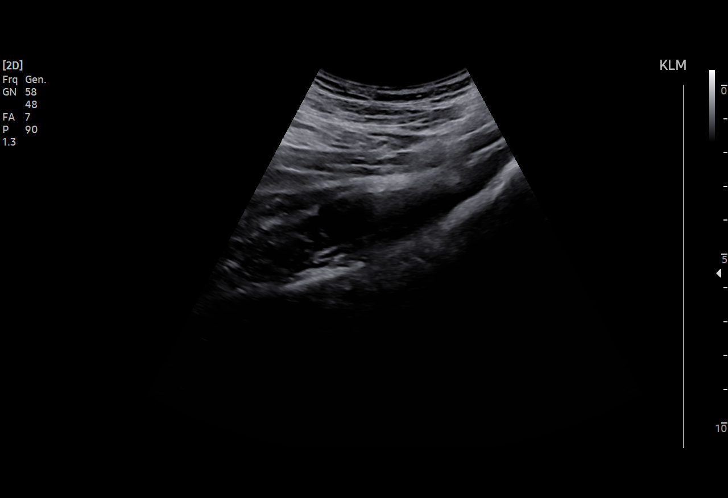
[im 15/51]
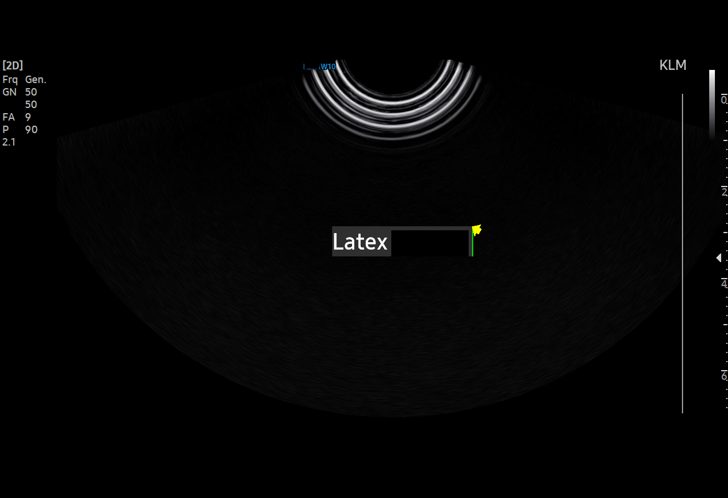
[im 19/51]
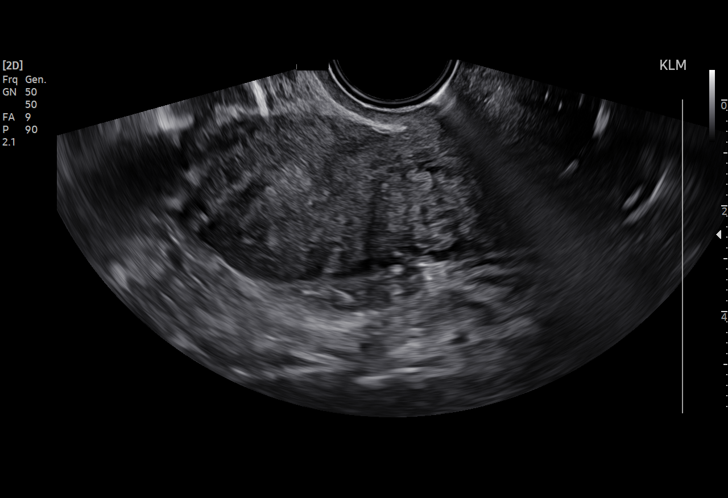
[im 23/51]
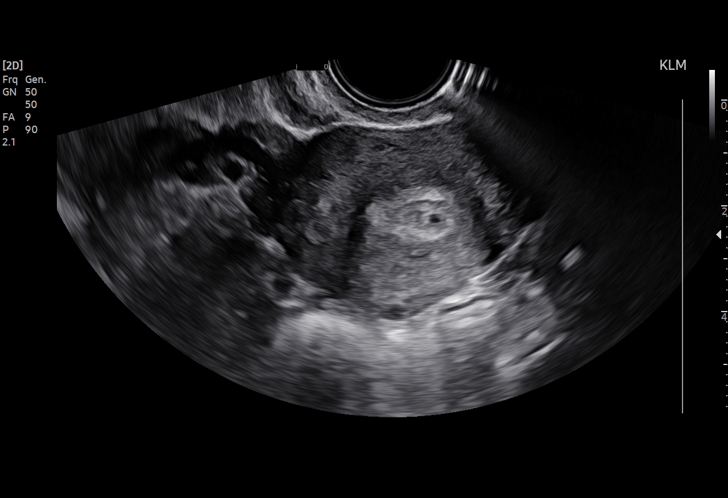
[im 26/51]
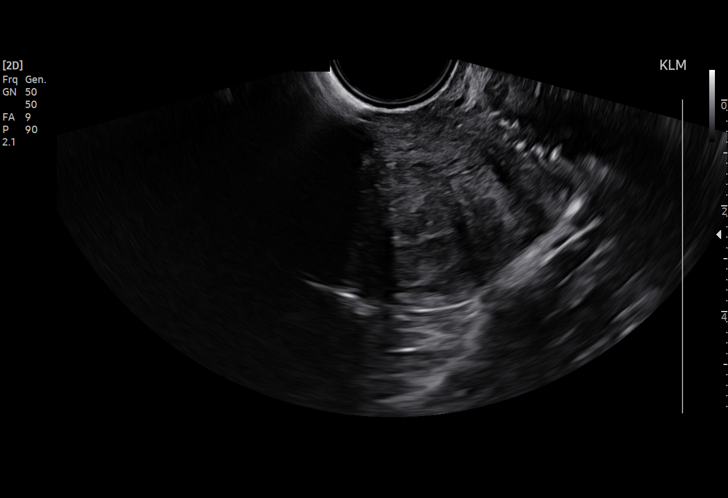
[im 28/51]
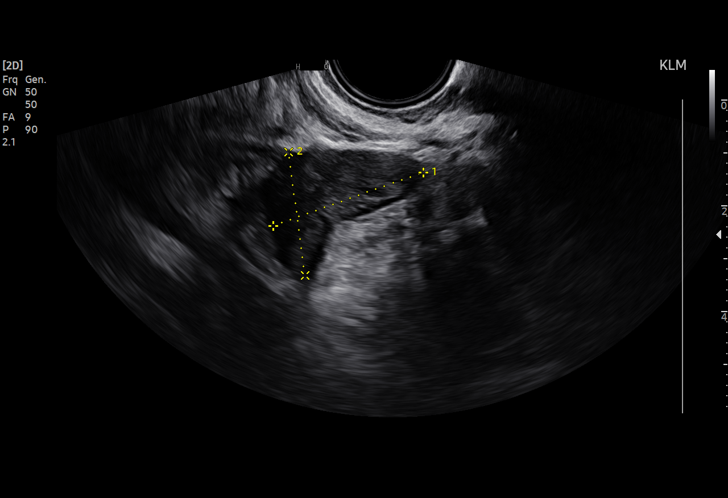
[im 32/51]
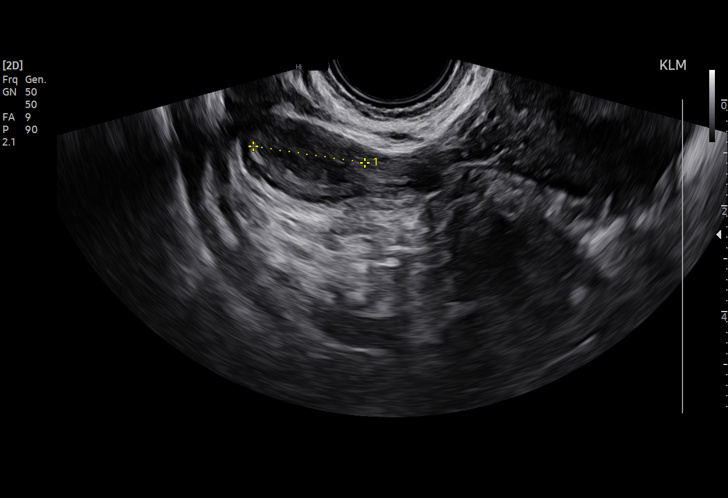
[im 36/51]
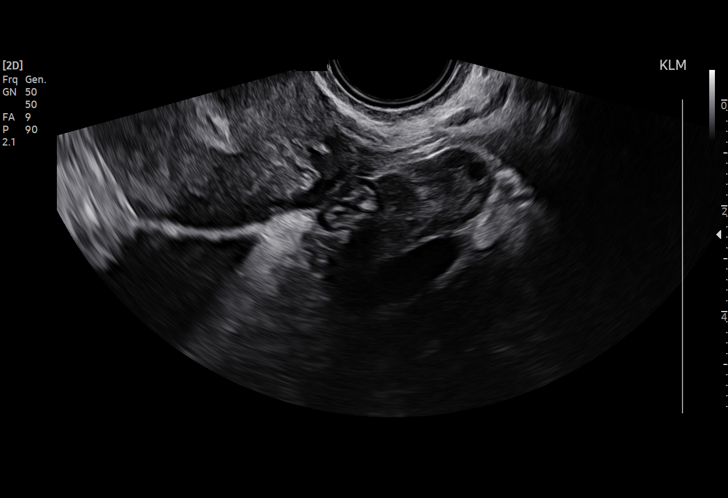
[im 39/51]
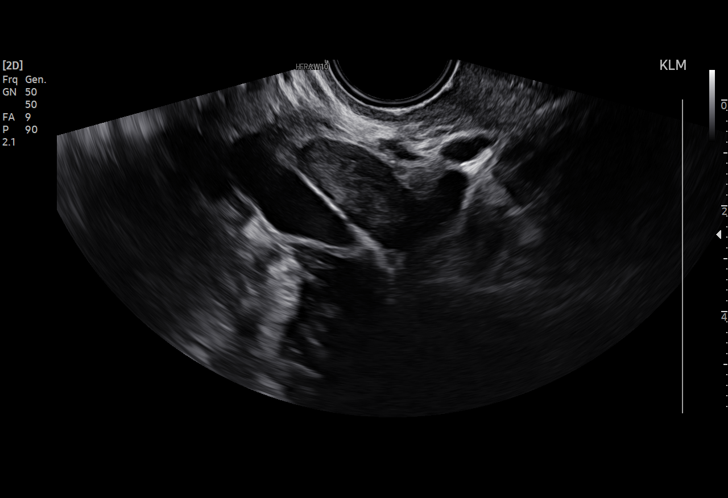
[im 43/51]
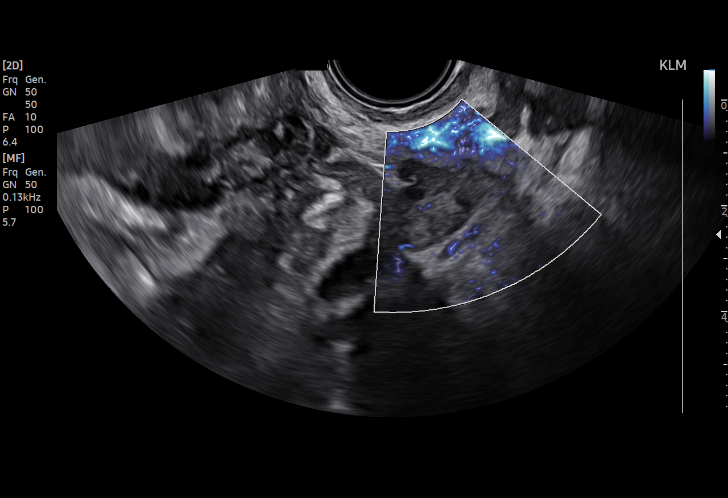
[im 47/51]
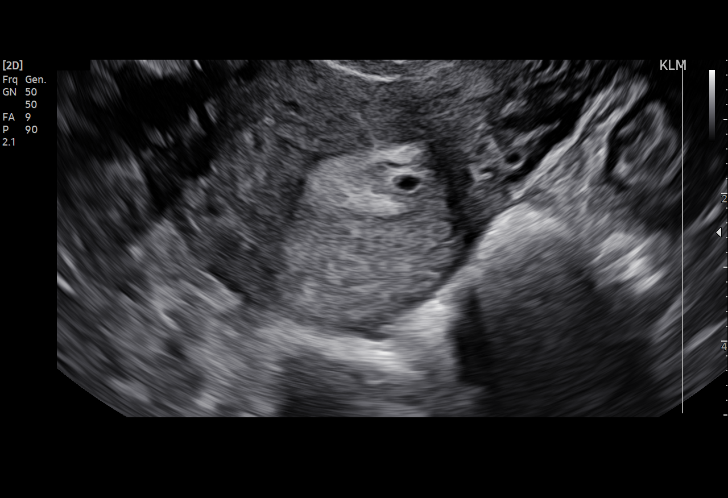
[im 51/51]
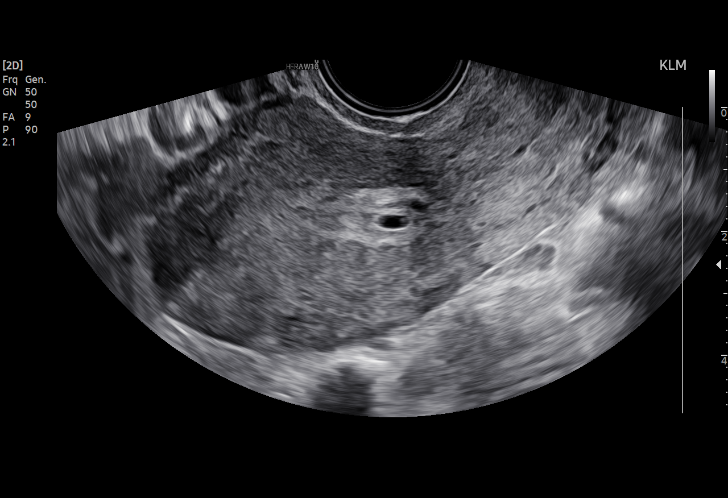

[15 of 28 positions shown; findings below may reference images not displayed]

FINDINGS: Intrauterine gestational sac: Saccular fluid collection visualized
within the lower uterine segment.

Yolk sac:  Not seen

Embryo:  Not seen

MSD: 3.5 mm   5 w   1 d

Subchorionic hemorrhage:  None visualized.

Maternal uterus/adnexae: Ovaries are within normal limits. The left
ovary measures 2.9 x 1.2 x 1.4 cm. The right ovary measures 2.1 by 3
x 2.4 cm. No significant free fluid
IMPRESSION: 1. Small saccular fluid collection within the uterus, possibly
representing a small gestational sac but appears low in position at
the lower uterine segment. No yolk sac or embryo is visualized.
Recommend trending of HCG with possible short-term ultrasound
follow-up.

## 2023-02-14 ENCOUNTER — Telehealth: Payer: Self-pay | Admitting: *Deleted

## 2023-02-14 NOTE — Telephone Encounter (Signed)
 Pt called to office asking what OTC meds she can take now that she is pregnant. Pt was made aware of safe meds to take in pregnancy.  Advised to f/u in office if symptoms persist.

## 2023-02-18 ENCOUNTER — Ambulatory Visit: Payer: Medicaid Other | Admitting: *Deleted

## 2023-02-18 VITALS — BP 107/70 | HR 73 | Ht 65.0 in | Wt 122.0 lb

## 2023-02-18 DIAGNOSIS — Z3201 Encounter for pregnancy test, result positive: Secondary | ICD-10-CM | POA: Diagnosis not present

## 2023-02-18 DIAGNOSIS — O219 Vomiting of pregnancy, unspecified: Secondary | ICD-10-CM

## 2023-02-18 DIAGNOSIS — Z3480 Encounter for supervision of other normal pregnancy, unspecified trimester: Secondary | ICD-10-CM

## 2023-02-18 LAB — POCT URINE PREGNANCY: Preg Test, Ur: POSITIVE — AB

## 2023-02-18 MED ORDER — PRENATAL PLUS VITAMIN/MINERAL 27-1 MG PO TABS: 1.0000 | ORAL_TABLET | Freq: Every day | ORAL | 12 refills | Status: AC

## 2023-02-18 MED ORDER — DOXYLAMINE-PYRIDOXINE 10-10 MG PO TBEC: 2.0000 | DELAYED_RELEASE_TABLET | Freq: Every day | ORAL | 5 refills | Status: DC

## 2023-02-18 MED ORDER — PROMETHAZINE HCL 25 MG PO TABS: 25.0000 mg | ORAL_TABLET | Freq: Four times a day (QID) | ORAL | 1 refills | Status: AC | PRN

## 2023-02-18 NOTE — Progress Notes (Signed)
 Janet Rodriguez presents today for UPT. She has no unusual complaints. LMP:     OBJECTIVE: Appears well, in no apparent distress.  OB History     Gravida  6   Para  3   Term  3   Preterm      AB  2   Living  3      SAB  2   IAB      Ectopic      Multiple      Live Births  3          Home UPT Result: Positive In-Office UPT result: Positive I have reviewed the patient's medical, obstetrical, social, and family histories, and medications.   ASSESSMENT: Positive pregnancy test  PLAN Prenatal care to be completed at: [facility]

## 2023-02-20 ENCOUNTER — Other Ambulatory Visit: Payer: Self-pay

## 2023-02-20 ENCOUNTER — Inpatient Hospital Stay (HOSPITAL_COMMUNITY)
Admission: AD | Admit: 2023-02-20 | Discharge: 2023-02-20 | Disposition: A | Discharge status: Home / Self Care | Payer: Medicaid Other | Attending: Obstetrics & Gynecology | Admitting: Obstetrics & Gynecology

## 2023-02-20 ENCOUNTER — Inpatient Hospital Stay (HOSPITAL_COMMUNITY): Payer: Medicaid Other

## 2023-02-20 DIAGNOSIS — N760 Acute vaginitis: Secondary | ICD-10-CM

## 2023-02-20 DIAGNOSIS — Z87891 Personal history of nicotine dependence: Secondary | ICD-10-CM | POA: Insufficient documentation

## 2023-02-20 DIAGNOSIS — Z3491 Encounter for supervision of normal pregnancy, unspecified, first trimester: Secondary | ICD-10-CM

## 2023-02-20 DIAGNOSIS — O209 Hemorrhage in early pregnancy, unspecified: Secondary | ICD-10-CM | POA: Diagnosis not present

## 2023-02-20 DIAGNOSIS — Z3A01 Less than 8 weeks gestation of pregnancy: Secondary | ICD-10-CM | POA: Insufficient documentation

## 2023-02-20 DIAGNOSIS — O23591 Infection of other part of genital tract in pregnancy, first trimester: Secondary | ICD-10-CM | POA: Diagnosis present

## 2023-02-20 DIAGNOSIS — B9689 Other specified bacterial agents as the cause of diseases classified elsewhere: Secondary | ICD-10-CM | POA: Diagnosis not present

## 2023-02-20 LAB — URINALYSIS, ROUTINE W REFLEX MICROSCOPIC
Bacteria, UA: NONE SEEN
Bilirubin Urine: NEGATIVE
Glucose, UA: NEGATIVE mg/dL
Ketones, ur: NEGATIVE mg/dL
Leukocytes,Ua: NEGATIVE
Nitrite: NEGATIVE
Protein, ur: NEGATIVE mg/dL
Specific Gravity, Urine: 1.005 (ref 1.005–1.030)
pH: 6 (ref 5.0–8.0)

## 2023-02-20 LAB — WET PREP, GENITAL
Sperm: NONE SEEN
Trich, Wet Prep: NONE SEEN
WBC, Wet Prep HPF POC: >=10 — AB (ref <10)
Yeast Wet Prep HPF POC: NONE SEEN

## 2023-02-20 LAB — CBC
HCT: 42.6 % (ref 36.0–46.0)
Hemoglobin: 14.1 g/dL (ref 12.0–15.0)
MCH: 30.6 pg (ref 26.0–34.0)
MCHC: 33.1 g/dL (ref 30.0–36.0)
MCV: 92.4 fL (ref 80.0–100.0)
Platelets: 369 10*3/uL (ref 150–400)
RBC: 4.61 MIL/uL (ref 3.87–5.11)
RDW: 14.5 % (ref 11.5–15.5)
WBC: 7.3 10*3/uL (ref 4.0–10.5)
nRBC: 0 % (ref 0.0–0.2)

## 2023-02-20 LAB — HCG, QUANTITATIVE, PREGNANCY: hCG, Beta Chain, Quant, S: 1608 m[IU]/mL — ABNORMAL HIGH (ref <5)

## 2023-02-20 MED ORDER — METRONIDAZOLE 500 MG PO TABS: 500.0000 mg | ORAL_TABLET | Freq: Two times a day (BID) | ORAL | 0 refills | Status: AC

## 2023-02-20 NOTE — MAU Provider Note (Signed)
 Chief Complaint: Spotting   None     SUBJECTIVE HPI: Janet Rodriguez is a 33 y.o. [id number] at [redacted]w[redacted]d by LMP who presents to maternity admissions reporting spotting when wiping today. Last intercourse was yesterday. There is no pain .  HPI  Past Medical History:  Diagnosis Date   BV (bacterial vaginosis)    Headache(784.0)    History of pelvic fracture    Infection    Past Surgical History:  Procedure Laterality Date   ROOT CANAL     TONSILLECTOMY     Social History   Socioeconomic History   Marital status: Single    Spouse name: Not on file   Number of children: Not on file   Years of education: Not on file   Highest education level: Not on file  Occupational History   Not on file  Tobacco Use   Smoking status: Former    Current packs/day: 0.00    Average packs/day: 0.3 packs/day for 10.0 years (2.5 ttl pk-yrs)    Types: Cigarettes    Start date: 10/27/2001    Quit date: 10/28/2011    Years since quitting: 11.3   Smokeless tobacco: Never   Tobacco comments:    NO longer smoking   Vaping Use   Vaping status: Every Day   Substances: Flavoring  Substance and Sexual Activity   Alcohol use: Not Currently   Drug use: No   Sexual activity: Yes    Partners: Male    Birth control/protection: None  Other Topics Concern   Not on file  Social History Narrative   Not on file   Social Drivers of Health   Financial Resource Strain: Not on file  Food Insecurity: Not on file  Transportation Needs: Not on file  Physical Activity: Not on file  Stress: Not on file  Social Connections: Not on file  Intimate Partner Violence: Not on file   No current facility-administered medications on file prior to encounter.   Current Outpatient Medications on File Prior to Encounter  Medication Sig Dispense Refill   Doxylamine -Pyridoxine  (DICLEGIS ) 10-10 MG TBEC Take 2 tablets by mouth at bedtime. If symptoms persist, add one tablet in the morning and one in the afternoon 100 tablet 5    metroNIDAZOLE  (FLAGYL ) 500 MG tablet Take 1 tablet (500 mg total) by mouth 2 (two) times daily. 14 tablet 0   Prenatal Vit-Fe Fumarate-FA (PRENATAL PLUS VITAMIN/MINERAL) 27-1 MG TABS Take 1 tablet by mouth daily. 30 tablet 12   Prenatal Vit-Fe Fumarate-FA (PRENATAL VITAMINS) 28-0.8 MG TABS Take 1 tablet by mouth daily. 30 tablet 11   promethazine  (PHENERGAN ) 25 MG tablet Take 1 tablet (25 mg total) by mouth every 6 (six) hours as needed for nausea or vomiting. 30 tablet 1   No Known Allergies  ROS:  Review of Systems  Constitutional:  Negative for chills, fatigue and fever.  Respiratory:  Negative for shortness of breath.   Cardiovascular:  Negative for chest pain.  Gastrointestinal:  Negative for abdominal pain, nausea and vomiting.  Genitourinary:  Positive for vaginal bleeding. Negative for difficulty urinating, dysuria, flank pain, pelvic pain, vaginal discharge and vaginal pain.  Neurological:  Negative for dizziness and headaches.  Psychiatric/Behavioral: Negative.       I have reviewed patient's Past Medical Hx, Surgical Hx, Family Hx, Social Hx, medications and allergies.   Physical Exam  Patient Vitals for the past 24 hrs:  BP Temp Temp src Pulse Resp SpO2 Height Weight  02/20/23 1209 99/66 98.7  F (37.1 C) Oral 72 18 100 % 5' 5.75 (1.67 m) 55.4 kg   Constitutional: Well-developed, well-nourished female in no acute distress.  Cardiovascular: normal rate Respiratory: normal effort GI: Abd soft, non-tender. Pos BS x 4 MS: Extremities nontender, no edema, normal ROM Neurologic: Alert and oriented x 4.  GU: Neg CVAT.  PELVIC EXAM: Deferred, self swabs collected  LAB RESULTS Results for orders placed or performed during the hospital encounter of 02/20/23 (from the past 24 hours)  Wet prep, genital     Status: Abnormal   Collection Time: 02/20/23 11:48 AM   Specimen: PATH Cytology Cervicovaginal Ancillary Only  Result Value Ref Range   Yeast Wet Prep HPF POC NONE  SEEN NONE SEEN   Trich, Wet Prep NONE SEEN NONE SEEN   Clue Cells Wet Prep HPF POC PRESENT (A) NONE SEEN   WBC, Wet Prep HPF POC >=10 (A) <10   Sperm NONE SEEN   Urinalysis, Routine w reflex microscopic -Urine, Clean Catch     Status: Abnormal   Collection Time: 02/20/23 12:02 PM  Result Value Ref Range   Color, Urine STRAW (A) YELLOW   APPearance CLEAR CLEAR   Specific Gravity, Urine 1.005 1.005 - 1.030   pH 6.0 5.0 - 8.0   Glucose, UA NEGATIVE NEGATIVE mg/dL   Hgb urine dipstick SMALL (A) NEGATIVE   Bilirubin Urine NEGATIVE NEGATIVE   Ketones, ur NEGATIVE NEGATIVE mg/dL   Protein, ur NEGATIVE NEGATIVE mg/dL   Nitrite NEGATIVE NEGATIVE   Leukocytes,Ua NEGATIVE NEGATIVE   RBC / HPF 0-5 0 - 5 RBC/hpf   WBC, UA 0-5 0 - 5 WBC/hpf   Bacteria, UA NONE SEEN NONE SEEN   Squamous Epithelial / HPF 0-5 0 - 5 /HPF  CBC     Status: None   Collection Time: 02/20/23 12:18 PM  Result Value Ref Range   WBC 7.3 4.0 - 10.5 K/uL   RBC 4.61 3.87 - 5.11 MIL/uL   Hemoglobin 14.1 12.0 - 15.0 g/dL   HCT 57.3 63.9 - 53.9 %   MCV 92.4 80.0 - 100.0 fL   MCH 30.6 26.0 - 34.0 pg   MCHC 33.1 30.0 - 36.0 g/dL   RDW 85.4 88.4 - 84.4 %   Platelets 369 150 - 400 K/uL   nRBC 0.0 0.0 - 0.2 %  hCG, quantitative, pregnancy     Status: Abnormal   Collection Time: 02/20/23 12:18 PM  Result Value Ref Range   hCG, Beta Chain, Quant, S 1,608 (H) <5 mIU/mL       IMAGING US  OB LESS THAN 14 WEEKS WITH OB TRANSVAGINAL Result Date: 02/20/2023 CLINICAL DATA:  Pregnant patient with vaginal bleeding. EXAM: OBSTETRIC <14 WK US  AND TRANSVAGINAL OB US  TECHNIQUE: Both transabdominal and transvaginal ultrasound examinations were performed for complete evaluation of the gestation as well as the maternal uterus, adnexal regions, and pelvic cul-de-sac. Transvaginal technique was performed to assess early pregnancy. COMPARISON:  None Available. FINDINGS: Intrauterine gestational sac: Single Yolk sac:  Not Visualized. Embryo:  Not  Visualized. Cardiac Activity: Not Visualized. MSD: 6.5 mm   5 w   3 d Subchorionic hemorrhage:  None visualized. Maternal uterus/adnexae: Normal right and left ovaries. No free fluid in the pelvis. IMPRESSION: Probable early intrauterine gestational sac, but no yolk sac, fetal pole, or cardiac activity yet visualized. Recommend follow-up quantitative B-HCG levels and follow-up US  in 14 days to assess viability. This recommendation follows SRU consensus guidelines: Diagnostic Criteria for Nonviable Pregnancy Early in the  First Trimester. LOISE Diedra PARAS Med 2013; 630:8556-48. Electronically Signed   By: Bard Moats M.D.   On: 02/20/2023 13:57    MAU Management/MDM: Orders Placed This Encounter  Procedures   Wet prep, genital   US  OB LESS THAN 14 WEEKS WITH OB TRANSVAGINAL   Urinalysis, Routine w reflex microscopic -Urine, Clean Catch   CBC   hCG, quantitative, pregnancy   Discharge patient    No orders of the defined types were placed in this encounter.   IUP with gestational sac on today's US . Pt tearful, worried that this is a miscarriage since there is only a gestational sac.  Discussed that results can be normal for early pregnancy and follow up US  will be needed.  Called [facility] to move viability US  to 1 week from now, instead of 2-3 weeks.  Return precautions reviewed.  Rx for Flagyl  for BV.   ASSESSMENT 1. Normal IUP (intrauterine pregnancy) on prenatal ultrasound, first trimester   2. Vaginal bleeding in pregnancy, first trimester   3. Bacterial vaginosis     PLAN Discharge home Allergies as of 02/20/2023   No Known Allergies      Medication List     TAKE these medications    Doxylamine -Pyridoxine  10-10 MG Tbec Commonly known as: Diclegis  Take 2 tablets by mouth at bedtime. If symptoms persist, add one tablet in the morning and one in the afternoon   metroNIDAZOLE  500 MG tablet Commonly known as: FLAGYL  Take 1 tablet (500 mg total) by mouth 2 (two) times daily.   Prenatal  Vitamins 28-0.8 MG Tabs Take 1 tablet by mouth daily.   Prenatal Plus Vitamin/Mineral 27-1 MG Tabs Take 1 tablet by mouth daily.   promethazine  25 MG tablet Commonly known as: PHENERGAN  Take 1 tablet (25 mg total) by mouth every 6 (six) hours as needed for nausea or vomiting.        Follow-up Information     [facility] Follow up.   Specialty: Obstetrics and Gynecology Why: As scheduled Contact information: [address] [place]  [address] [phone]        [facility] Follow up.   Specialty: Obstetrics and Gynecology Why: As needed for emergencies Contact information: [address] [place]  [address] [phone]                Olam Boards Certified Nurse-Midwife 02/20/2023  3:14 PM

## 2023-02-20 NOTE — MAU Note (Signed)
 Janet Rodriguez is a 33 y.o. at [redacted]w[redacted]d here in MAU reporting: she's having spotting when wiping.  Reports last intercourse was yesterday.  Denies pain  LMP: NA Onset of complaint: today Pain score: 0 Vitals:   02/20/23 1209  BP: 99/66  Pulse: 72  Resp: 18  Temp: 98.7 F (37.1 C)  SpO2: 100%     FHT:NA Lab orders placed from triage: UA

## 2023-02-21 LAB — GC/CHLAMYDIA PROBE AMP (~~LOC~~) NOT AT ARMC
Chlamydia: NEGATIVE
Comment: NEGATIVE
Comment: NORMAL
Neisseria Gonorrhea: NEGATIVE

## 2023-02-22 ENCOUNTER — Telehealth: Payer: Self-pay | Admitting: *Deleted

## 2023-02-22 DIAGNOSIS — O039 Complete or unspecified spontaneous abortion without complication: Secondary | ICD-10-CM

## 2023-02-22 NOTE — Telephone Encounter (Signed)
 Laiklyn called reporting continued signs of SAB following MAU visit. Pt is tearful.Condolences and emotional support provided. Pt advised to return to MAU for any heavy VB, dizziness or worsening pain. Pt referred to in office [facility]. Offered pt appt with provider to discuss recurrent SABs. Pt prefers to come in to the office for scheduled US  on 1/16 and then proceed from there. Emergency [facility] resources given.

## 2023-02-28 ENCOUNTER — Ambulatory Visit: Payer: Medicaid Other | Admitting: *Deleted

## 2023-02-28 ENCOUNTER — Other Ambulatory Visit (INDEPENDENT_AMBULATORY_CARE_PROVIDER_SITE_OTHER): Payer: Medicaid Other

## 2023-02-28 VITALS — BP 109/72 | HR 78 | Wt 130.3 lb

## 2023-02-28 DIAGNOSIS — O021 Missed abortion: Secondary | ICD-10-CM | POA: Diagnosis not present

## 2023-02-28 DIAGNOSIS — Z3A01 Less than 8 weeks gestation of pregnancy: Secondary | ICD-10-CM

## 2023-02-28 DIAGNOSIS — O209 Hemorrhage in early pregnancy, unspecified: Secondary | ICD-10-CM

## 2023-02-28 DIAGNOSIS — Z3480 Encounter for supervision of other normal pregnancy, unspecified trimester: Secondary | ICD-10-CM

## 2023-02-28 NOTE — Progress Notes (Signed)
See telephone encounter 02/21/22. Pt reports that she believes the miscarriage is complete. She had VB for 5 days and passed clots and some "tissue". She has no further VB or pain now. She is in no apparent distress and has stable VS. Pt requests bedside US "for piece of mind". Pt is aware that a formal US can be ordered and completed by an ultrasonographer and read by a radiologist. Pt is aware that today's [place] is not diagnostic. On today's [place] GS, YS, FP were NOT seen. Uterus viewed in both sagittal and transverse views. Uterine stripe seen. No IUP. Pt was offered MD consult and future appt with MD to discuss recurrent SAB's. Pt declines. Pt was offered bHCG for confirmation. Pt declines. Pt given bleeding, pain and fever precautions with instructions to seek care in MAU for those symptoms. Pt verbalized understanding.

## 2023-02-28 NOTE — Patient Instructions (Signed)
Miscarriage A miscarriage is the loss of a pregnancy before the 20th week of pregnancy. Sometimes, a pregnancy ends before a woman knows that she is pregnant. If you lose a pregnancy, talk with your doctor about: Questions you have about the loss of your baby. How to work through your grief. Plans for future pregnancy. What are the causes? Many times, the cause of this condition is not known. What increases the risk? These things may make a pregnant woman more likely to lose a pregnancy: Certain health conditions Conditions that affect hormones, such as: Thyroid disease. Polycystic ovary syndrome. Diabetes. A disease that causes the body's disease-fighting system to attack itself by mistake. Infections. Bleeding problems. Being very overweight. Lifestyle factors Using products that have tobacco or nicotine in them. Being around tobacco smoke. Having alcohol. Having a lot of caffeine. Using drugs. Problems with reproductive organs or parts Having a cervix that opens and thins before your due date. The cervix is the lowest part of your womb. Having Asherman syndrome, which leads to: Scars in the womb. The womb being abnormal in shape. Growths (fibroids) in the womb. Problems in the body that are present at birth. Infection of the cervix or womb. Personal or health history Injury. Having lost a pregnancy before. Being younger than age 37 or older than age 85. Being around a harmful substance, such as radiation. Having lead or other heavy metals in: Things you eat or drink. The air around you. Using certain medicines. What are the signs or symptoms? Blood or spots of blood coming from the vagina. You may also have cramps or pain. Pain or cramps in the belly or low back. Fluid or tissue coming out of the vagina. How is this treated? Sometimes, treatment is not needed. If you need treatment, you may be treated with: A procedure to open the cervix more and take tissue out of  the womb. Medicines. You may get a shot of medicine called Rho(D) immune globulin. Follow these instructions at home: Medicines Take over-the-counter and prescription medicines only as told by your doctor. If you were prescribed antibiotic medicine, take it as told by your doctor. Do not stop taking it even if you start to feel better. Activity Rest as told by your doctor. Ask your doctor what activities are safe for you. Have someone help you at home during this time. General instructions  Watch how much tissue comes out of the vagina. Watch the size of any blood clots that come out of the vagina. Do not have sex or douche until your doctor says it is okay. Do not put things, such as tampons, in your vagina until your doctor says it is okay. To help you and your partner with grieving: Talk with your doctor. See a Veterinary surgeon. When you are ready, talk with your doctor about: Things to do for your health. How you can be healthy if you get pregnant again. Keep all follow-up visits. Where to find more information [employer] of Obstetricians and Gynecologists: acog.org U.S. Department of Health and Cytogeneticist of Women's Health: http://hoffman.com/ Contact a doctor if: You have a fever or chills. There is bad-smelling fluid coming from the vagina. You have more bleeding. Tissue or clots of blood come out of your vagina. Get help right away if: You have very bad cramps or pain in your back or belly. You soak more than 2 large pads in an hour for more than 2 hours. You get light-headed or weak. You faint.  You feel sad, and you have sad thoughts a lot of the time. You think about hurting yourself. Get help right awayif you feel like you may hurt yourself or others, or have thoughts about taking your own life. Go to your nearest emergency room or: Call your local emergency services (911 in the [place] Call the National Suicide Prevention Lifeline at  567 190 6748 or 988 in the [place] This is open 24 hours a day. Text the Crisis Text Line at 606 274 5953. Summary A miscarriage is the loss of a pregnancy before the 20th week of pregnancy. Sometimes, a pregnancy ends before a woman knows that she is pregnant. Follow instructions from your doctor about medicines and activity. To help you and your partner with grieving, talk with your doctor or a counselor. Keep all follow-up visits. This information is not intended to replace advice given to you by your health care provider. Make sure you discuss any questions you have with your health care provider. Document Revised: 08/24/2020 Document Reviewed: 07/31/2019 Elsevier Patient Education  2024 [employer].

## 2023-04-01 ENCOUNTER — Encounter: Payer: Medicaid Other | Admitting: Advanced Practice Midwife

## 2023-06-09 ENCOUNTER — Ambulatory Visit (HOSPITAL_COMMUNITY): Admission: EM | Admit: 2023-06-09 | Discharge: 2023-06-09 | Disposition: A | Discharge status: Home / Self Care

## 2023-06-09 ENCOUNTER — Encounter (HOSPITAL_COMMUNITY): Payer: Self-pay | Admitting: Emergency Medicine

## 2023-06-09 ENCOUNTER — Other Ambulatory Visit: Payer: Self-pay

## 2023-06-09 DIAGNOSIS — K529 Noninfective gastroenteritis and colitis, unspecified: Secondary | ICD-10-CM | POA: Diagnosis not present

## 2023-06-09 MED ORDER — ONDANSETRON 4 MG PO TBDP
4.0000 mg | ORAL_TABLET | Freq: Three times a day (TID) | ORAL | 0 refills | Status: AC | PRN
Start: 2023-06-09 — End: ?

## 2023-06-09 MED ORDER — ONDANSETRON 4 MG PO TBDP
4.0000 mg | ORAL_TABLET | Freq: Once | ORAL | Status: AC
  Administered 2023-06-09: 4 mg via ORAL

## 2023-06-09 MED ORDER — SIMETHICONE 125 MG PO CHEW: 125.0000 mg | CHEWABLE_TABLET | Freq: Four times a day (QID) | ORAL | 0 refills | Status: AC | PRN

## 2023-06-09 MED ORDER — ONDANSETRON 4 MG PO TBDP
ORAL_TABLET | ORAL | Status: AC
  Filled 2023-06-09: qty 1

## 2023-06-09 NOTE — ED Triage Notes (Signed)
 Pt reports nausea and vomiting since Friday.

## 2023-06-09 NOTE — ED Provider Notes (Signed)
 [facility]  Note:  This document was prepared using Dragon voice recognition software and may include unintentional dictation errors.  MRN: [id number] DOB: 1990-07-13  Subjective:   Janet Rodriguez is a 33 y.o. female presenting for nausea vomiting and diarrhea since Friday.  Patient reports moderate abdominal cramping and increased flatulence.  Patient denies any known sick contacts with similar symptoms.  Patient is not taking any over-the-counter medication to treat symptoms.  No fever, shortness of breath, chest pain, weakness, dizziness.  Patient reports that she has been able to drink a little bit of water today but has been afraid to eat any food for fear that she may vomit some more.  No current facility-administered medications for this encounter.  Current Outpatient Medications:    ondansetron  (ZOFRAN -ODT) 4 MG disintegrating tablet, Take 1 tablet (4 mg total) by mouth every 8 (eight) hours as needed for nausea or vomiting., Disp: 20 tablet, Rfl: 0   simethicone  (MYLICON) 125 MG chewable tablet, Chew 1 tablet (125 mg total) by mouth every 6 (six) hours as needed for flatulence., Disp: 30 tablet, Rfl: 0   metroNIDAZOLE  (FLAGYL ) 500 MG tablet, Take 1 tablet (500 mg total) by mouth 2 (two) times daily., Disp: 14 tablet, Rfl: 0   Prenatal Vit-Fe Fumarate-FA (PRENATAL PLUS VITAMIN/MINERAL) 27-1 MG TABS, Take 1 tablet by mouth daily., Disp: 30 tablet, Rfl: 12   Prenatal Vit-Fe Fumarate-FA (PRENATAL VITAMINS) 28-0.8 MG TABS, Take 1 tablet by mouth daily., Disp: 30 tablet, Rfl: 11   promethazine  (PHENERGAN ) 25 MG tablet, Take 1 tablet (25 mg total) by mouth every 6 (six) hours as needed for nausea or vomiting. (Patient not taking: Reported on 02/28/2023), Disp: 30 tablet, Rfl: 1   No Known Allergies  Past Medical History:  Diagnosis Date   BV (bacterial vaginosis)    Headache(784.0)    History of pelvic fracture    Infection      Past Surgical History:  Procedure  Laterality Date   ROOT CANAL     TONSILLECTOMY      Family History  Problem Relation Age of Onset   Cancer Maternal Grandfather    Cancer Paternal Grandmother    Healthy Mother    Healthy Father     Social History   Tobacco Use   Smoking status: Former    Current packs/day: 0.00    Average packs/day: 0.3 packs/day for 10.0 years (2.5 ttl pk-yrs)    Types: Cigarettes    Start date: 10/27/2001    Quit date: 10/28/2011    Years since quitting: 11.6   Smokeless tobacco: Never   Tobacco comments:    NO longer smoking   Vaping Use   Vaping status: Every Day   Substances: Flavoring  Substance Use Topics   Alcohol use: Not Currently   Drug use: No    ROS Refer to HPI for ROS details.  Objective:   Vitals: BP 101/73 (BP Location: Right Arm)   Pulse 63   Temp 98.6 F (37 C) (Oral)   Resp 18   LMP 05/20/2023 (Approximate)   SpO2 98%   Physical Exam Vitals and nursing note reviewed.  Constitutional:      General: She is not in acute distress.    Appearance: She is well-developed. She is not ill-appearing or toxic-appearing.  HENT:     Head: Normocephalic and atraumatic.     Mouth/Throat:     Mouth: Mucous membranes are moist.     Pharynx: Oropharynx is clear.  Cardiovascular:  Rate and Rhythm: Normal rate.  Pulmonary:     Effort: Pulmonary effort is normal. No respiratory distress.  Abdominal:     General: There is no distension.     Palpations: Abdomen is soft.     Tenderness: There is abdominal tenderness. There is no right CVA tenderness, left CVA tenderness, guarding or rebound.  Skin:    General: Skin is warm and dry.  Neurological:     General: No focal deficit present.     Mental Status: She is alert and oriented to person, place, and time.  Psychiatric:        Mood and Affect: Mood normal.        Behavior: Behavior normal.     Procedures  No results found for this or any previous visit (from the past 24 hours).  No results found.    Assessment and Plan :     Discharge Instructions       1. Gastroenteritis (Primary) - ondansetron  (ZOFRAN -ODT) disintegrating tablet 4 mg given in UC for acute nausea and vomiting symptoms. - ondansetron  (ZOFRAN -ODT) 4 MG disintegrating tablet; Take 1 tablet (4 mg total) by mouth every 8 (eight) hours as needed for nausea or vomiting.  Dispense: 20 tablet; Refill: 0 - simethicone  (MYLICON) 125 MG chewable tablet; Chew 1 tablet (125 mg total) by mouth every 6 (six) hours as needed for flatulence.  Dispense: 30 tablet; Refill: 0  -Continue to monitor symptoms for any change in severity if there is any escalation of current symptoms or development of new symptoms follow-up in ER for further evaluation and management.      Trygve Thal B Mikal Blasdell   Porschea Borys, [place], [place] 06/09/23 1555

## 2023-06-09 NOTE — Discharge Instructions (Addendum)
  1. Gastroenteritis (Primary) - ondansetron  (ZOFRAN -ODT) disintegrating tablet 4 mg given in UC for acute nausea and vomiting symptoms. - ondansetron  (ZOFRAN -ODT) 4 MG disintegrating tablet; Take 1 tablet (4 mg total) by mouth every 8 (eight) hours as needed for nausea or vomiting.  Dispense: 20 tablet; Refill: 0 - simethicone  (MYLICON) 125 MG chewable tablet; Chew 1 tablet (125 mg total) by mouth every 6 (six) hours as needed for flatulence.  Dispense: 30 tablet; Refill: 0  -Continue to monitor symptoms for any change in severity if there is any escalation of current symptoms or development of new symptoms follow-up in ER for further evaluation and management.
